# Patient Record
Sex: Female | Born: 1980
Health system: Southern US, Community
[De-identification: ages and names within clinical notes are randomized; demographics above are authoritative.]

## PROBLEM LIST (undated history)

## (undated) DIAGNOSIS — E119 Type 2 diabetes mellitus without complications: Secondary | ICD-10-CM

## (undated) DIAGNOSIS — F419 Anxiety disorder, unspecified: Secondary | ICD-10-CM

## (undated) DIAGNOSIS — J45909 Unspecified asthma, uncomplicated: Secondary | ICD-10-CM

## (undated) DIAGNOSIS — E669 Obesity, unspecified: Secondary | ICD-10-CM

## (undated) HISTORY — PX: DENTAL SURGERY: SHX609

---

## 2003-08-16 ENCOUNTER — Emergency Department (HOSPITAL_COMMUNITY): Admission: EM | Admit: 2003-08-16 | Discharge: 2003-08-16 | Payer: Self-pay | Admitting: Emergency Medicine

## 2003-08-31 ENCOUNTER — Encounter: Payer: Self-pay | Admitting: Emergency Medicine

## 2003-08-31 ENCOUNTER — Emergency Department (HOSPITAL_COMMUNITY): Admission: EM | Admit: 2003-08-31 | Discharge: 2003-08-31 | Payer: Self-pay | Admitting: Emergency Medicine

## 2005-11-17 ENCOUNTER — Inpatient Hospital Stay (HOSPITAL_COMMUNITY): Admission: AD | Admit: 2005-11-17 | Discharge: 2005-11-20 | Payer: Self-pay | Admitting: Family Medicine

## 2005-11-17 ENCOUNTER — Ambulatory Visit: Payer: Self-pay | Admitting: *Deleted

## 2005-12-09 ENCOUNTER — Emergency Department (HOSPITAL_COMMUNITY): Admission: EM | Admit: 2005-12-09 | Discharge: 2005-12-09 | Payer: Self-pay | Admitting: *Deleted

## 2006-07-10 ENCOUNTER — Emergency Department (HOSPITAL_COMMUNITY): Admission: EM | Admit: 2006-07-10 | Discharge: 2006-07-10 | Payer: Self-pay | Admitting: Emergency Medicine

## 2007-03-23 ENCOUNTER — Emergency Department (HOSPITAL_COMMUNITY): Admission: EM | Admit: 2007-03-23 | Discharge: 2007-03-23 | Payer: Self-pay | Admitting: Emergency Medicine

## 2007-04-20 ENCOUNTER — Emergency Department (HOSPITAL_COMMUNITY): Admission: EM | Admit: 2007-04-20 | Discharge: 2007-04-20 | Payer: Self-pay | Admitting: Emergency Medicine

## 2007-05-12 ENCOUNTER — Emergency Department (HOSPITAL_COMMUNITY): Admission: EM | Admit: 2007-05-12 | Discharge: 2007-05-12 | Payer: Self-pay | Admitting: Emergency Medicine

## 2008-02-11 ENCOUNTER — Inpatient Hospital Stay (HOSPITAL_COMMUNITY): Admission: AD | Admit: 2008-02-11 | Discharge: 2008-02-11 | Payer: Self-pay | Admitting: Obstetrics and Gynecology

## 2008-02-17 ENCOUNTER — Emergency Department (HOSPITAL_COMMUNITY): Admission: EM | Admit: 2008-02-17 | Discharge: 2008-02-17 | Payer: Self-pay | Admitting: Emergency Medicine

## 2008-04-16 ENCOUNTER — Emergency Department (HOSPITAL_COMMUNITY): Admission: EM | Admit: 2008-04-16 | Discharge: 2008-04-17 | Payer: Self-pay | Admitting: Emergency Medicine

## 2009-03-04 ENCOUNTER — Emergency Department (HOSPITAL_COMMUNITY): Admission: EM | Admit: 2009-03-04 | Discharge: 2009-03-04 | Payer: Self-pay | Admitting: Emergency Medicine

## 2009-11-10 ENCOUNTER — Inpatient Hospital Stay (HOSPITAL_COMMUNITY): Admission: EM | Admit: 2009-11-10 | Discharge: 2009-11-21 | Payer: Self-pay | Admitting: Psychiatry

## 2009-11-10 ENCOUNTER — Ambulatory Visit: Payer: Self-pay | Admitting: Psychiatry

## 2010-02-25 ENCOUNTER — Emergency Department (HOSPITAL_COMMUNITY): Admission: EM | Admit: 2010-02-25 | Discharge: 2010-02-26 | Payer: Self-pay | Admitting: Emergency Medicine

## 2010-02-26 ENCOUNTER — Inpatient Hospital Stay (HOSPITAL_COMMUNITY): Admission: AD | Admit: 2010-02-26 | Discharge: 2010-03-03 | Payer: Self-pay | Admitting: Psychiatry

## 2010-02-26 ENCOUNTER — Ambulatory Visit: Payer: Self-pay | Admitting: Psychiatry

## 2010-04-05 ENCOUNTER — Ambulatory Visit: Payer: Self-pay | Admitting: Pulmonary Disease

## 2010-04-05 DIAGNOSIS — G4733 Obstructive sleep apnea (adult) (pediatric): Secondary | ICD-10-CM

## 2010-08-12 ENCOUNTER — Emergency Department (HOSPITAL_COMMUNITY): Admission: EM | Admit: 2010-08-12 | Discharge: 2010-08-12 | Payer: Self-pay | Admitting: Emergency Medicine

## 2010-08-16 ENCOUNTER — Emergency Department (HOSPITAL_COMMUNITY): Admission: EM | Admit: 2010-08-16 | Discharge: 2010-08-16 | Payer: Self-pay | Admitting: Family Medicine

## 2010-08-21 ENCOUNTER — Emergency Department (HOSPITAL_COMMUNITY): Admission: EM | Admit: 2010-08-21 | Discharge: 2010-08-21 | Payer: Self-pay | Admitting: Family Medicine

## 2010-08-26 ENCOUNTER — Emergency Department (HOSPITAL_COMMUNITY): Admission: EM | Admit: 2010-08-26 | Discharge: 2010-08-26 | Payer: Self-pay | Admitting: Emergency Medicine

## 2011-01-21 NOTE — Assessment & Plan Note (Signed)
Summary: consult for osa   Copy to:  Dr Phill Mutter Primary Provider/Referring Provider:  Clyda Greener  CC:  Sleep Consult , pt has trouble staying asleep , and takes her 2 hrs to fall asleep averages 4 hrs per night.  History of Present Illness: The pt is a 30y/o female who I have been asked to see for possible osa.  She has been noted to have snoring per observers, as well as an abnormal breathing pattern during sleep.  She denies any choking arousals.  She typically goes to bed around 9pm, and does have difficulty getting to sleep.  It may take her 2 hours to fall asleep.  She arises at 8am to start her day, and does not feel rested.  She also notes frequent awakenings during the night.  She admits to dozing during the day with periods of inactivity, and can fall asleep watching tv in the evening hours.  She does have sleepiness with driving longer distances.  Her weight is up at least 50 pounds over the last 2 years, and her epworth score today is 16.    Past History:  Past Medical History: Asthma allergic disease depression schizophrenia  Past Surgical History: none per pt  Family History: Reviewed history and no changes required. Father Heart Disease Mother asthma and allergy Cousin breast Cancer  Social History: Reviewed history and no changes required. Single with 1 child Homemaker Smokes 3 cigarettes per day  Review of Systems       The patient complains of shortness of breath at rest, weight change, anxiety, and depression.  The patient denies shortness of breath with activity, productive cough, non-productive cough, coughing up blood, chest pain, irregular heartbeats, acid heartburn, indigestion, loss of appetite, abdominal pain, difficulty swallowing, sore throat, tooth/dental problems, headaches, nasal congestion/difficulty breathing through nose, sneezing, itching, ear ache, hand/feet swelling, joint stiffness or pain, rash, change in color of mucus, and fever.     Vital Signs:  Patient profile:   30 year old female Height:      62 inches Weight:      265.2 pounds BMI:     48.68 O2 Sat:      96 % on Room air Temp:     98.3 degrees F oral Pulse rate:   93 / minute  Vitals Entered By: Renold Genta RCP, LPN (April 05, 2010 1:45 PM)  O2 Flow:  Room air CC: Sleep Consult , pt has trouble staying asleep , takes her 2 hrs to fall asleep averages 4 hrs per night Comments Medications reviewed with patient Renold Genta RCP, LPN  April 05, 2010 1:56 PM    Physical Exam  General:  obese female in nad Eyes:  PERRLA and EOMI.   Nose:  mild turbinate hypertrophy Mouth:  mod elongation of soft palate and uvula, with soft tissue redundancy posteriorly Neck:  ?prominent thyroid, no LN, no jvd Lungs:  clear to auscultation Heart:  rrr, no mrg. Abdomen:  soft and nontender, bs+ Extremities:  no edema or cyanosis pulses intact distally Neurologic:  alert and oriented, moves all 4.   Impression & Recommendations:  Problem # 1:  OBSTRUCTIVE SLEEP APNEA (ICD-327.23) The pt's history is very suggestive of osa.  She has gained a large amount of weight, has snoring and pauses noted by observers, and notes nonrestorative sleep with EDS.  I have had a long discussion with the pt about sleep apnea, including its impact on her QOL and CV health.  I think she needs  a sleep study for diagnosis, and will arrange f/u once the results are available.  I have also asked her to try and go to bed later, and this may be responsible for her sleep onset issues.  Medications Added to Medication List This Visit: 1)  Fluoxetine Hcl 10 Mg Caps (Fluoxetine hcl) .... 2 capsules once daily 2)  Levothroid 25 Mcg Tabs (Levothyroxine sodium) .Marland Kitchen.. 1 once daily 3)  Fluphenazine Hcl 10 Mg Tabs (Fluphenazine hcl) .... 1/2 at dinner and 1 at bedtime  Other Orders: Consultation Level IV (16109) Sleep Disorder Referral (Sleep Disorder)  Patient Instructions: 1)  will set up for  sleep study.  I will call you with results when available. 2)  work on weight loss.

## 2011-03-17 LAB — TSH: TSH: 3.704 u[IU]/mL (ref 0.350–4.500)

## 2011-03-17 LAB — URINALYSIS, ROUTINE W REFLEX MICROSCOPIC
Bilirubin Urine: NEGATIVE
Glucose, UA: NEGATIVE mg/dL
Hgb urine dipstick: NEGATIVE
Ketones, ur: NEGATIVE mg/dL
Nitrite: NEGATIVE
Protein, ur: NEGATIVE mg/dL
Specific Gravity, Urine: 1.013 (ref 1.005–1.030)
Urobilinogen, UA: 0.2 mg/dL (ref 0.0–1.0)
pH: 5.5 (ref 5.0–8.0)

## 2011-03-17 LAB — COMPREHENSIVE METABOLIC PANEL
ALT: 9 U/L (ref 0–35)
AST: 16 U/L (ref 0–37)
Albumin: 4.1 g/dL (ref 3.5–5.2)
Alkaline Phosphatase: 57 U/L (ref 39–117)
BUN: 7 mg/dL (ref 6–23)
CO2: 25 mEq/L (ref 19–32)
Calcium: 10.2 mg/dL (ref 8.4–10.5)
Chloride: 104 mEq/L (ref 96–112)
Creatinine, Ser: 0.97 mg/dL (ref 0.4–1.2)
GFR calc Af Amer: >60 mL/min (ref >60)
GFR calc non Af Amer: >60 mL/min (ref >60)
Glucose, Bld: 94 mg/dL (ref 70–99)
Potassium: 3.9 mEq/L (ref 3.5–5.1)
Sodium: 138 mEq/L (ref 135–145)
Total Bilirubin: 0.3 mg/dL (ref 0.3–1.2)
Total Protein: 7.8 g/dL (ref 6.0–8.3)

## 2011-03-17 LAB — RAPID URINE DRUG SCREEN, HOSP PERFORMED
Amphetamines: NOT DETECTED
Barbiturates: NOT DETECTED
Benzodiazepines: NOT DETECTED
Cocaine: NOT DETECTED
Opiates: NOT DETECTED
Tetrahydrocannabinol: NOT DETECTED

## 2011-03-17 LAB — DIFFERENTIAL
Basophils Absolute: 0 10*3/uL (ref 0.0–0.1)
Basophils Relative: 0 % (ref 0–1)
Eosinophils Absolute: 0.2 10*3/uL (ref 0.0–0.7)
Eosinophils Relative: 1 % (ref 0–5)
Lymphocytes Relative: 13 % (ref 12–46)
Lymphs Abs: 1.6 10*3/uL (ref 0.7–4.0)
Monocytes Absolute: 0.5 10*3/uL (ref 0.1–1.0)
Monocytes Relative: 4 % (ref 3–12)
Neutro Abs: 10.3 10*3/uL — ABNORMAL HIGH (ref 1.7–7.7)
Neutrophils Relative %: 82 % — ABNORMAL HIGH (ref 43–77)

## 2011-03-17 LAB — LITHIUM LEVEL: Lithium Lvl: 0.59 mEq/L — ABNORMAL LOW (ref 0.80–1.40)

## 2011-03-17 LAB — CBC
HCT: 41.8 % (ref 36.0–46.0)
Hemoglobin: 13.5 g/dL (ref 12.0–15.0)
MCHC: 32.4 g/dL (ref 30.0–36.0)
MCV: 83.1 fL (ref 78.0–100.0)
Platelets: 417 10*3/uL — ABNORMAL HIGH (ref 150–400)
RBC: 5.02 MIL/uL (ref 3.87–5.11)
RDW: 13 % (ref 11.5–15.5)
WBC: 12.6 10*3/uL — ABNORMAL HIGH (ref 4.0–10.5)

## 2011-03-17 LAB — ETHANOL: Alcohol, Ethyl (B): <5 mg/dL (ref 0–10)

## 2011-03-17 LAB — GLUCOSE, CAPILLARY: Glucose-Capillary: 123 mg/dL — ABNORMAL HIGH (ref 70–99)

## 2011-03-17 LAB — PREGNANCY, URINE: Preg Test, Ur: NEGATIVE

## 2011-03-26 LAB — DRUGS OF ABUSE SCREEN W/O ALC, ROUTINE URINE
Amphetamine Screen, Ur: NEGATIVE
Barbiturate Quant, Ur: NEGATIVE
Benzodiazepines.: NEGATIVE
Cocaine Metabolites: NEGATIVE
Creatinine,U: 76.3 mg/dL
Marijuana Metabolite: NEGATIVE
Methadone: NEGATIVE
Opiate Screen, Urine: NEGATIVE
Phencyclidine (PCP): NEGATIVE
Propoxyphene: NEGATIVE

## 2011-03-26 LAB — URINALYSIS, ROUTINE W REFLEX MICROSCOPIC
Bilirubin Urine: NEGATIVE
Glucose, UA: NEGATIVE mg/dL
Hgb urine dipstick: NEGATIVE
Ketones, ur: NEGATIVE mg/dL
Nitrite: NEGATIVE
Protein, ur: NEGATIVE mg/dL
Specific Gravity, Urine: 1.015 (ref 1.005–1.030)
Urobilinogen, UA: 0.2 mg/dL (ref 0.0–1.0)
pH: 6 (ref 5.0–8.0)

## 2011-03-26 LAB — COMPREHENSIVE METABOLIC PANEL
ALT: 11 U/L (ref 0–35)
AST: 18 U/L (ref 0–37)
Albumin: 3.5 g/dL (ref 3.5–5.2)
Alkaline Phosphatase: 70 U/L (ref 39–117)
BUN: 10 mg/dL (ref 6–23)
CO2: 27 mEq/L (ref 19–32)
Calcium: 9.4 mg/dL (ref 8.4–10.5)
Chloride: 101 mEq/L (ref 96–112)
Creatinine, Ser: 1.09 mg/dL (ref 0.4–1.2)
GFR calc Af Amer: >60 mL/min (ref >60)
GFR calc non Af Amer: 59 mL/min — ABNORMAL LOW (ref >60)
Glucose, Bld: 136 mg/dL — ABNORMAL HIGH (ref 70–99)
Potassium: 3.9 mEq/L (ref 3.5–5.1)
Sodium: 136 mEq/L (ref 135–145)
Total Bilirubin: 0.3 mg/dL (ref 0.3–1.2)
Total Protein: 7.4 g/dL (ref 6.0–8.3)

## 2011-03-26 LAB — PREGNANCY, URINE: Preg Test, Ur: NEGATIVE

## 2011-03-26 LAB — VALPROIC ACID LEVEL
Valproic Acid Lvl: 44 ug/mL — ABNORMAL LOW (ref 50.0–100.0)
Valproic Acid Lvl: 48.1 ug/mL — ABNORMAL LOW (ref 50.0–100.0)

## 2011-03-26 LAB — CBC
HCT: 38.8 % (ref 36.0–46.0)
Hemoglobin: 12.9 g/dL (ref 12.0–15.0)
MCHC: 33.1 g/dL (ref 30.0–36.0)
MCV: 84.3 fL (ref 78.0–100.0)
Platelets: 281 10*3/uL (ref 150–400)
RBC: 4.61 MIL/uL (ref 3.87–5.11)
RDW: 12.9 % (ref 11.5–15.5)
WBC: 8.3 10*3/uL (ref 4.0–10.5)

## 2011-04-03 LAB — POCT I-STAT 4, (NA,K, GLUC, HGB,HCT)
Glucose, Bld: 99 mg/dL (ref 70–99)
HCT: 39 % (ref 36.0–46.0)
Potassium: 3.8 mEq/L (ref 3.5–5.1)

## 2011-05-06 NOTE — Op Note (Signed)
NAMEAUSTRALIA, DROLL               ACCOUNT NO.:  [id number]   MEDICAL RECORD NO.:  [id number]          PATIENT TYPE:  EMS   LOCATION:  MAJO                         FACILITY:  [facility]   PHYSICIAN:  Norma Done, MD  DATE OF BIRTH:  1981/12/10   DATE OF PROCEDURE:  03/04/2009  DATE OF DISCHARGE:                               OPERATIVE REPORT   PREOPERATIVE DIAGNOSIS:  Left forearm laceration with possible nerve  involvement and tendon involvement.   POSTOPERATIVE DIAGNOSIS:  Left forearm laceration with tendon  involvement.   ATTENDING SURGEON:  Norma Covert IV, MD, who was scrubbed and present  for the entire procedure.   ASSISTANT SURGEON:  None.   SURGICAL PROCEDURE:  1. Left forearm ulnar nerve exploration and neurolysis in the level of      the forearm.  2. Left forearm repair of flexor carpi ulnaris muscle.  3. Repair of left forearm laceration, 5.5 cm.   ANESTHESIA:  General via endotracheal tube.   INTRAOPERATIVE FINDINGS:  The patient did have a laceration over the  dorsal ulnar aspect of the forearm which extended all the way down to  the ulna.  The ulnar nerve was in continuity.  The laceration extended  at the musculotendinous junction of the FCU.  The ECU was in continuity.   SURGICAL INDICATIONS:  Norma Rogers is a 30 year old right-hand dominant  female who fell off a ladder and sustained a sharp blow to her left  forearm and sustained a laceration.  The patient presented to the  emergency department with a 5 cm laceration and complaining of numbness  in her hand directly over the ulnar nerve distribution.  The patient was  recommended to undergo the above procedure given the exam findings with  the altered sensation of the ulnar nerve as well as decreased hand  intrinsic strength.  Risks, benefits, and alternatives were discussed in  detail with the patient and signed informed consent was obtained.  Risks  include but not limited to bleeding, infection,  damage to nearby nerves,  arteries or tendons, persistent numbness, loss of motion in the wrist  and digits, and need for further surgical intervention.   DESCRIPTION OF PROCEDURE:  The patient was properly identified in the  preoperative holding area, mark with permanent marker was made on the  left forearm to indicate correct operative site.  The patient was then  brought back to the operating room, placed supine on the anesthesia room  table where general anesthesia was administered.  The patient tolerated  this well.  A well-padded tourniquet was then placed on the left  brachium and sealed with a 1000 drape.  The left upper extremity was  then prepped with Hibiclens and then sterilely draped.  The time-out was  called.  The correct side was identified and the procedure was then  begun.  The left forearm laceration which was approximately 5 cm was  then extended proximally and distally as the limb has been elevated and  the tourniquet insufflated.  Dissection was carried down through the  skin and subcutaneous tissues.  The laceration  extended into the FCU  muscle belly and region at the musculotendinous junction.  This extended  into the ECU region, but not through the ECU tendon.  The ECU tendon was  in continuity.  Following this, further dissection was then carried  volarly, and the ulnar nerve and artery were then both identified.  The  nerve was then identified proximally and distally, and neurolysis was  Rogers surrounding the region of the nerve making sure there was no  identifiable trauma to the nerve.  After neurolysis of the nerve, the  FCU musculotendinous junction was then repaired with a running 2-0  Vicryl suture reinforcing the fascial layer directly over the muscle and  the tendon.  Following this, the wounds were then thoroughly irrigated.  The laceration was then closed in layers.  The subcutaneous tissues  closed with 4-0 Monocryl and the skin closed with horizontal  mattress 4-  0 Prolene sutures.  Adaptic dressing and sterile compressive dressing  were then applied.  The patient tolerated the procedure well, returned  to recovery room in good condition.   POSTOPERATIVE PLAN:  The patient will be discharged to home, she should  keep the splint that was applied clean and dry, as well as I will see  her back in the office in 10 days for wound check and suture removal and  then likely graduate over to short-arm splint for a total of two more  additional Bala, and then begin activity as tolerated.      Norma Done, MD  Electronically Signed     FWO/MEDQ  D:  03/04/2009  T:  03/05/2009  Job:  [id number]

## 2011-05-09 NOTE — Discharge Summary (Signed)
Norma Rogers, Norma Rogers               ACCOUNT NO.:  [id number]   MEDICAL RECORD NO.:  [id number]          PATIENT TYPE:  INP   LOCATION:  9128                          FACILITY:  [facility]   PHYSICIAN:  Barth Kirks, M.D.  DATE OF BIRTH:  1981/05/26   DATE OF ADMISSION:  11/17/2005  DATE OF DISCHARGE:  11/20/2005                                 DISCHARGE SUMMARY   DISCHARGE DIAGNOSES:  1.  Full-term delivery, normal spontaneous vaginal delivery at questionable      35 and 2 Boot.  2.  Delivery of a viable female infant.  3.  Preeclampsia.   DISCHARGE MEDICATIONS:  1.  Ibuprofen 600 mg, one tablet p.o. q.6 h. as needed for pain.  2.  Prenatal vitamins one tablet daily.  3.  Colace 100 mg p.o. b.i.d. as needed as a stool softener.   BRIEF HISTORY OF PRESENT ILLNESS:  The patient is a 30 year old gravida 1,  para 0, who presented at 15 and 2 [place] best estimated gestational age with  uncertain dates of an LMP, dated by an ultrasound performed on November 17, 2005.  The patient was sent from [facility] where her  pregnancy was diagnosed on November 17, 2005.  The patient stated that she  was uncertain and did not know that she was pregnant, thought she had  fibroids, and presented to the [facility] ER complaining of carpal tunnel  like symptoms.  At the time of [facility], they did  a pregnancy test and found out she was pregnant and she was noted to have  contractions appropriately every two to three minutes.  The patient was sent  from [facility] over to the Maternity Admissions Unit, where she was  examined.   BRIEF HOSPITAL COURSE:  The patient was admitted Labor and Delivery on  November 17, 2005.  She was noted to have Trichomonas, as well as in preterm  labor by best estimated gestational age.  There was concern for elevated  blood pressures and preeclampsia.  The patient was started on magnesium, as  well as penicillin during the  delivery, for her unknown GBS status.  The  patient was noted to be contracting and went on to a normal spontaneous  vaginal delivery on November 18, 2005 and delivered a viable female infant  with Apgars of 9 and 9.  The patient was delayed suction due to meconium  stained amnionic fluid.  The patient was under epidural anesthesia.  The  baby was dually suctioned at the perineum.  Spontaneous normal three-vessel  placenta was delivered and cord-blood, and cord-gases were sent.  There was  a secondary laceration repair with 3-0 Vicryl.  The patient and infant were  stable.  The patient was transferred then to the adult ICU and was continued  on her magnesium at that time.  The patient complained of chest pain on her  postpartum day #1, which was ruled out for heart/cardiac origin by negative  cardiac enzymes, as well as a normal EKG.  Chest x-ray revealed bilateral  atelectasis.  The patient  was encouraged to use incentive spirometry.  The  patient continued on and her chest pain resolved on postpartum day #2, as  well as the patient began diuresing well.  Magnesium was discontinued on  November 19, 2005 and the patient remained stable with her blood pressures  on date of discharge in 138/88.  The patient is stable and now ready for  discharge.  Her pain was well controlled.  She would like Depo for  contraception, as well as being rubella equivocal.  She will receive vaccine  prior to discharge.  The patient will follow up in six [place] at [facility].  The patient was noted to be [facility] eligible and will be needing  assistance obtaining financial help for the baby.   LABORATORY ON DATE OF DISCHARGE:  Rubella is equivocal.  RPR was  nonreactive.  Hepatitis B surface antigen was negative.  Her GBS was  positive, and patient received penicillin during the delivery.  Blood type  was 0+.  HIV was nonreactive.      Barth Kirks, M.D.     MB/MEDQ  D:  11/20/2005  T:  11/20/2005  Job:   [id number]

## 2011-09-12 LAB — POCT PREGNANCY, URINE
Operator id: 220991
Operator id: 27065

## 2011-09-12 LAB — URINE MICROSCOPIC-ADD ON

## 2011-09-12 LAB — URINALYSIS, ROUTINE W REFLEX MICROSCOPIC
Ketones, ur: NEGATIVE
Leukocytes, UA: NEGATIVE
Nitrite: NEGATIVE
Protein, ur: NEGATIVE
Protein, ur: NEGATIVE
Urobilinogen, UA: 0.2

## 2011-09-12 LAB — WET PREP, GENITAL: Clue Cells Wet Prep HPF POC: NONE SEEN

## 2011-09-12 LAB — GC/CHLAMYDIA PROBE AMP, GENITAL: GC Probe Amp, Genital: NEGATIVE

## 2011-09-12 LAB — URINE CULTURE

## 2011-12-18 ENCOUNTER — Emergency Department (HOSPITAL_BASED_OUTPATIENT_CLINIC_OR_DEPARTMENT_OTHER)
Admission: EM | Admit: 2011-12-18 | Discharge: 2011-12-18 | Disposition: A | Discharge status: Home / Self Care | Payer: Medicare Other | Attending: Emergency Medicine | Admitting: Emergency Medicine

## 2011-12-18 DIAGNOSIS — Z79899 Other long term (current) drug therapy: Secondary | ICD-10-CM | POA: Insufficient documentation

## 2011-12-18 DIAGNOSIS — F17200 Nicotine dependence, unspecified, uncomplicated: Secondary | ICD-10-CM | POA: Insufficient documentation

## 2011-12-18 DIAGNOSIS — G8918 Other acute postprocedural pain: Secondary | ICD-10-CM | POA: Insufficient documentation

## 2011-12-18 DIAGNOSIS — K089 Disorder of teeth and supporting structures, unspecified: Secondary | ICD-10-CM | POA: Insufficient documentation

## 2011-12-18 MED ORDER — OXYCODONE-ACETAMINOPHEN 5-325 MG PO TABS
1.0000 | ORAL_TABLET | Freq: Once | ORAL | Status: AC
  Administered 2011-12-18: 1 via ORAL
  Filled 2011-12-18: qty 1

## 2011-12-18 MED ORDER — OXYCODONE-ACETAMINOPHEN 5-325 MG PO TABS: 1.0000 | ORAL_TABLET | Freq: Four times a day (QID) | ORAL | Status: AC | PRN

## 2011-12-18 NOTE — ED Notes (Signed)
Pt c/o pain from wisdom teeth extraction on 12/19.   Pt states she received Rx for Ibuprofen but is still in pain.

## 2011-12-18 NOTE — ED Provider Notes (Signed)
History     CSN: [id number]  Arrival date & time 12/18/11  1522   First MD Initiated Contact with Patient 12/18/11 1727      Chief Complaint  Patient presents with  . Dental Pain    (Consider location/radiation/quality/duration/timing/severity/associated sxs/prior treatment) Patient is a 30 y.o. female presenting with tooth pain. The history is provided by the patient.  Dental PainPrimary symptoms do not include sore throat or cough.  Additional symptoms do not include: fatigue.   patient states that she had all 4 wisdom teeth and 2 other teeth removed on December 19. She states she has a high pain threshold was, given ibuprofen. She states that is not working enough, and she's been taking naproxen also. No fevers. No numbness or weakness. No drooling.  History reviewed. No pertinent past medical history.  Past Surgical History  Procedure Date  . Dental surgery     No family history on file.  History  Substance Use Topics  . Smoking status: Current Everyday Smoker  . Smokeless tobacco: Not on file  . Alcohol Use: Yes    OB History    Grav Para Term Preterm Abortions TAB SAB Ect Mult Living                  Review of Systems  Constitutional: Negative for fatigue.  HENT: Positive for dental problem. Negative for sore throat and mouth sores.   Eyes: Negative for pain.  Respiratory: Negative for cough.     Allergies  Review of patient's allergies indicates no known allergies.  Home Medications   Current Outpatient Rx  Name Route Sig Dispense Refill  . AMOXICILLIN 500 MG PO CAPS Oral Take 500 mg by mouth 3 (three) times daily.      . IBUPROFEN 800 MG PO TABS Oral Take 800 mg by mouth every 6 (six) hours as needed. For pain     . MEDROXYPROGESTERONE ACETATE 150 MG/ML IM SUSP Intramuscular Inject 150 mg into the muscle every 3 (three) months.      Marland Kitchen NAPROXEN SODIUM 220 MG PO TABS Oral Take 440 mg by mouth 2 (two) times daily as needed. For pain     .  OXYCODONE-ACETAMINOPHEN 5-325 MG PO TABS Oral Take 1-2 tablets by mouth every 6 (six) hours as needed for pain. 20 tablet 0    BP 138/86  Pulse 84  Temp(Src) 98.3 F (36.8 C) (Oral)  Resp 16  Ht 5\' 3"  (1.6 m)  Wt 225 lb (102.059 kg)  BMI 39.86 kg/m2  SpO2 100%  LMP 11/30/2011  Physical Exam  Constitutional: She appears well-developed.  HENT:  Head: Normocephalic and atraumatic.       All 4 wisdom teeth post extraction. Also post extraction left upper tooth anterior to wisdom tooth. Also left lower left of midline tooth. Extraction sites appear well.    ED Course  Procedures (including critical care time)  Labs Reviewed - No data to display No results found.   1. Post-operative pain       MDM  Post dental extraction. Has pain. No signs of infection appears to be healing well. We'll give him pain medicines and will follow with her dentist.        Juliet Rude. Rubin Payor, MD 12/18/11 1737

## 2013-03-07 ENCOUNTER — Emergency Department (HOSPITAL_BASED_OUTPATIENT_CLINIC_OR_DEPARTMENT_OTHER): Payer: Medicare Other

## 2013-03-07 ENCOUNTER — Encounter (HOSPITAL_BASED_OUTPATIENT_CLINIC_OR_DEPARTMENT_OTHER): Payer: Self-pay | Admitting: *Deleted

## 2013-03-07 ENCOUNTER — Emergency Department (HOSPITAL_BASED_OUTPATIENT_CLINIC_OR_DEPARTMENT_OTHER)
Admission: EM | Admit: 2013-03-07 | Discharge: 2013-03-07 | Disposition: A | Discharge status: Home / Self Care | Payer: Medicare Other | Attending: Emergency Medicine | Admitting: Emergency Medicine

## 2013-03-07 DIAGNOSIS — F17200 Nicotine dependence, unspecified, uncomplicated: Secondary | ICD-10-CM | POA: Insufficient documentation

## 2013-03-07 DIAGNOSIS — M549 Dorsalgia, unspecified: Secondary | ICD-10-CM | POA: Insufficient documentation

## 2013-03-07 DIAGNOSIS — R42 Dizziness and giddiness: Secondary | ICD-10-CM | POA: Insufficient documentation

## 2013-03-07 DIAGNOSIS — IMO0001 Reserved for inherently not codable concepts without codable children: Secondary | ICD-10-CM | POA: Insufficient documentation

## 2013-03-07 DIAGNOSIS — F0781 Postconcussional syndrome: Secondary | ICD-10-CM

## 2013-03-07 DIAGNOSIS — Z79899 Other long term (current) drug therapy: Secondary | ICD-10-CM | POA: Insufficient documentation

## 2013-03-07 MED ORDER — TRAMADOL HCL 50 MG PO TABS: 50.0000 mg | ORAL_TABLET | Freq: Four times a day (QID) | ORAL | Status: DC | PRN

## 2013-03-07 MED ORDER — HYDROCODONE-ACETAMINOPHEN 5-325 MG PO TABS
2.0000 | ORAL_TABLET | Freq: Once | ORAL | Status: AC
  Administered 2013-03-07: 2 via ORAL
  Filled 2013-03-07: qty 2
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
## 2013-03-07 NOTE — ED Provider Notes (Signed)
Medical screening examination/treatment/procedure(s) were performed by non-physician practitioner and as supervising physician I was immediately available for consultation/collaboration.   Gwyneth Sprout, MD 03/07/13 [phone]

## 2014-04-24 ENCOUNTER — Ambulatory Visit (HOSPITAL_BASED_OUTPATIENT_CLINIC_OR_DEPARTMENT_OTHER): Payer: Medicare Other | Attending: Medical

## 2015-10-17 ENCOUNTER — Encounter (HOSPITAL_COMMUNITY): Payer: Self-pay | Admitting: *Deleted

## 2015-10-17 ENCOUNTER — Emergency Department (HOSPITAL_COMMUNITY): Payer: Medicare Other

## 2015-10-17 ENCOUNTER — Emergency Department (HOSPITAL_COMMUNITY)
Admission: EM | Admit: 2015-10-17 | Discharge: 2015-10-17 | Disposition: A | Discharge status: Home / Self Care | Payer: Medicare Other | Attending: Emergency Medicine | Admitting: Emergency Medicine

## 2015-10-17 DIAGNOSIS — E1165 Type 2 diabetes mellitus with hyperglycemia: Secondary | ICD-10-CM | POA: Diagnosis present

## 2015-10-17 DIAGNOSIS — R062 Wheezing: Secondary | ICD-10-CM

## 2015-10-17 DIAGNOSIS — Z720 Tobacco use: Secondary | ICD-10-CM | POA: Insufficient documentation

## 2015-10-17 DIAGNOSIS — E669 Obesity, unspecified: Secondary | ICD-10-CM | POA: Diagnosis not present

## 2015-10-17 DIAGNOSIS — Z792 Long term (current) use of antibiotics: Secondary | ICD-10-CM | POA: Insufficient documentation

## 2015-10-17 DIAGNOSIS — F419 Anxiety disorder, unspecified: Secondary | ICD-10-CM | POA: Diagnosis not present

## 2015-10-17 DIAGNOSIS — J45901 Unspecified asthma with (acute) exacerbation: Secondary | ICD-10-CM | POA: Diagnosis not present

## 2015-10-17 DIAGNOSIS — R739 Hyperglycemia, unspecified: Secondary | ICD-10-CM

## 2015-10-17 HISTORY — DX: Type 2 diabetes mellitus without complications: E11.9

## 2015-10-17 HISTORY — DX: Unspecified asthma, uncomplicated: J45.909

## 2015-10-17 HISTORY — DX: Obesity, unspecified: E66.9

## 2015-10-17 HISTORY — DX: Anxiety disorder, unspecified: F41.9

## 2015-10-17 LAB — URINALYSIS, ROUTINE W REFLEX MICROSCOPIC
Bilirubin Urine: NEGATIVE
GLUCOSE, UA: NEGATIVE mg/dL
HGB URINE DIPSTICK: NEGATIVE
KETONES UR: NEGATIVE mg/dL
Leukocytes, UA: NEGATIVE
Nitrite: NEGATIVE
PH: 5.5 (ref 5.0–8.0)
PROTEIN: NEGATIVE mg/dL
Specific Gravity, Urine: 1.014 (ref 1.005–1.030)
Urobilinogen, UA: 0.2 mg/dL (ref 0.0–1.0)

## 2015-10-17 LAB — CBC
HCT: 30.6 % — ABNORMAL LOW (ref 36.0–46.0)
Hemoglobin: 10.3 g/dL — ABNORMAL LOW (ref 12.0–15.0)
MCH: 25.8 pg — AB (ref 26.0–34.0)
MCHC: 33.7 g/dL (ref 30.0–36.0)
MCV: 76.7 fL — ABNORMAL LOW (ref 78.0–100.0)
PLATELETS: 344 10*3/uL (ref 150–400)
RBC: 3.99 MIL/uL (ref 3.87–5.11)
RDW: 13.7 % (ref 11.5–15.5)
WBC: 12 10*3/uL — ABNORMAL HIGH (ref 4.0–10.5)

## 2015-10-17 LAB — BASIC METABOLIC PANEL
Anion gap: 12 (ref 5–15)
BUN: 7 mg/dL (ref 6–20)
CO2: 20 mmol/L — AB (ref 22–32)
CREATININE: 0.82 mg/dL (ref 0.44–1.00)
Calcium: 8.7 mg/dL — ABNORMAL LOW (ref 8.9–10.3)
Chloride: 103 mmol/L (ref 101–111)
GFR calc Af Amer: >60 mL/min (ref >60)
GFR calc non Af Amer: >60 mL/min (ref >60)
GLUCOSE: 208 mg/dL — AB (ref 65–99)
Potassium: 3.9 mmol/L (ref 3.5–5.1)
Sodium: 135 mmol/L (ref 135–145)

## 2015-10-17 LAB — CBG MONITORING, ED: Glucose-Capillary: 251 mg/dL — ABNORMAL HIGH (ref 65–99)

## 2015-10-17 MED ORDER — ALBUTEROL SULFATE (2.5 MG/3ML) 0.083% IN NEBU
5.0000 mg | INHALATION_SOLUTION | Freq: Once | RESPIRATORY_TRACT | Status: AC
  Administered 2015-10-17: 5 mg via RESPIRATORY_TRACT

## 2015-10-17 MED ORDER — ALBUTEROL SULFATE HFA 108 (90 BASE) MCG/ACT IN AERS: 2.0000 | INHALATION_SPRAY | RESPIRATORY_TRACT | Status: DC | PRN

## 2015-10-17 MED ORDER — IPRATROPIUM BROMIDE 0.02 % IN SOLN
0.5000 mg | Freq: Once | RESPIRATORY_TRACT | Status: AC
  Administered 2015-10-17: 0.5 mg via RESPIRATORY_TRACT
  Filled 2015-10-17: qty 2.5

## 2015-10-17 MED ORDER — PREDNISONE 20 MG PO TABS: 60.0000 mg | ORAL_TABLET | Freq: Every day | ORAL | Status: DC

## 2015-10-17 MED ORDER — METFORMIN HCL 500 MG PO TABS: 1000.0000 mg | ORAL_TABLET | Freq: Two times a day (BID) | ORAL | Status: AC

## 2015-10-17 MED ORDER — SODIUM CHLORIDE 0.9 % IV BOLUS (SEPSIS)
500.0000 mL | Freq: Once | INTRAVENOUS | Status: AC
  Administered 2015-10-17: 500 mL via INTRAVENOUS

## 2015-10-17 MED ORDER — ALBUTEROL SULFATE (2.5 MG/3ML) 0.083% IN NEBU
5.0000 mg | INHALATION_SOLUTION | Freq: Once | RESPIRATORY_TRACT | Status: AC
  Administered 2015-10-17: 5 mg via RESPIRATORY_TRACT
  Filled 2015-10-17: qty 6

## 2015-10-17 MED ORDER — METHYLPREDNISOLONE SODIUM SUCC 125 MG IJ SOLR
125.0000 mg | Freq: Once | INTRAMUSCULAR | Status: AC
  Administered 2015-10-17: 125 mg via INTRAVENOUS
  Filled 2015-10-17: qty 2

## 2015-10-17 MED ORDER — ALBUTEROL SULFATE (2.5 MG/3ML) 0.083% IN NEBU
INHALATION_SOLUTION | RESPIRATORY_TRACT | Status: AC
  Filled 2015-10-17: qty 6

## 2015-10-17 NOTE — ED Provider Notes (Signed)
CSN: [id number]     Arrival date & time 10/17/15  1808 History   First MD Initiated Contact with Patient 10/17/15 1837     Chief Complaint  Patient presents with  . Hyperglycemia  . Wheezing     (Consider location/radiation/quality/duration/timing/severity/associated sxs/prior Treatment) Patient is a 34 y.o. female presenting with hyperglycemia and wheezing. The history is provided by the patient.  Hyperglycemia Associated symptoms: polyuria and shortness of breath   Associated symptoms: no abdominal pain, no chest pain, no confusion, no dysuria, no fever and no vomiting   Wheezing Associated symptoms: cough and shortness of breath   Associated symptoms: no chest pain, no fever, no headaches, no rash and no sore throat   Patient notes hx niddm, on metformin, and that blood sugar was 500 when checked at home.  +polyuria. +drinking a lot of water. Denies dysuria. No fever or chills. Also states hx asthma, and out of inhaler. Pt notes increased wheezing the past week. +increased coughing, occasionally with yellow phlegm.  States stopped smoking 1 month ago. Denies sore throat or runny nose. No known ill contacts. No nvd.  No recent wt change.  Was on depo, last had 2 yrs ago.        Past Medical History  Diagnosis Date  . Obesity   . Diabetes mellitus without complication (HCC)   . Anxiety   . Asthma    Past Surgical History  Procedure Laterality Date  . Dental surgery     History reviewed. No pertinent family history. Social History  Substance Use Topics  . Smoking status: Current Every Day Smoker  . Smokeless tobacco: None  . Alcohol Use: Yes   OB History    No data available     Review of Systems  Constitutional: Negative for fever and chills.  HENT: Negative for sore throat.   Eyes: Negative for redness.  Respiratory: Positive for cough, shortness of breath and wheezing.   Cardiovascular: Negative for chest pain.  Gastrointestinal: Negative for vomiting,  abdominal pain and diarrhea.  Endocrine: Positive for polyuria.  Genitourinary: Negative for dysuria, flank pain, vaginal bleeding and vaginal discharge.  Musculoskeletal: Negative for back pain and neck pain.  Skin: Negative for rash.  Neurological: Negative for headaches.  Hematological: Does not bruise/bleed easily.  Psychiatric/Behavioral: Negative for confusion.      Allergies  Lamictal  Home Medications   Prior to Admission medications   Medication Sig Start Date End Date Taking? Authorizing Provider  amoxicillin (AMOXIL) 500 MG capsule Take 500 mg by mouth 3 (three) times daily.      Historical Provider, MD  ibuprofen (ADVIL,MOTRIN) 800 MG tablet Take 800 mg by mouth every 6 (six) hours as needed. For pain     Historical Provider, MD  medroxyPROGESTERone (DEPO-PROVERA) 150 MG/ML injection Inject 150 mg into the muscle every 3 (three) months.      Historical Provider, MD  naproxen sodium (ANAPROX) 220 MG tablet Take 440 mg by mouth 2 (two) times daily as needed. For pain     Historical Provider, MD  Ondansetron HCl (ZOFRAN PO) Take by mouth.    Historical Provider, MD  traMADol (ULTRAM) 50 MG tablet Take 1 tablet (50 mg total) by mouth every 6 (six) hours as needed for pain. 03/07/13   Kaitlyn Szekalski, PA-C   BP 123/80 mmHg  Pulse 120  Temp(Src) 98.7 F (37.1 C) (Oral)  Resp 26  Ht  (1.575 m)  Wt 332 lb 3.2 oz (150.685 kg)  BMI  60.74 kg/m2  SpO2 97%  LMP 09/11/2015 Physical Exam  Constitutional: She appears well-developed and well-nourished. No distress.  HENT:  Mouth/Throat: Oropharynx is clear and moist.  Eyes: Conjunctivae are normal. No scleral icterus.  Neck: Neck supple. No tracheal deviation present.  Cardiovascular: Regular rhythm, normal heart sounds and intact distal pulses.  Exam reveals no gallop and no friction rub.   No murmur heard. Pulmonary/Chest: Effort normal. No respiratory distress. She has wheezes.  Abdominal: Soft. Normal appearance  and bowel sounds are normal. She exhibits no distension. There is no tenderness.  obese  Genitourinary:  No cva tenderness  Musculoskeletal:  Bilateral ankle/foot edema  Neurological: She is alert.  Skin: Skin is warm and dry. No rash noted.  Psychiatric: She has a normal mood and affect.  Nursing note and vitals reviewed.   ED Course  Procedures (including critical care time) Labs Review   Results for orders placed or performed during the hospital encounter of 10/17/15  Basic metabolic panel  Result Value Ref Range   Sodium 135 135 - 145 mmol/L   Potassium 3.9 3.5 - 5.1 mmol/L   Chloride 103 101 - 111 mmol/L   CO2 20 (L) 22 - 32 mmol/L   Glucose, Bld 208 (H) 65 - 99 mg/dL   BUN 7 6 - 20 mg/dL   Creatinine, Ser 1.610.82 0.44 - 1.00 mg/dL   Calcium 8.7 (L) 8.9 - 10.3 mg/dL   GFR calc non Af Amer >60 >60 mL/min   GFR calc Af Amer >60 >60 mL/min   Anion gap 12 5 - 15  CBC  Result Value Ref Range   WBC 12.0 (H) 4.0 - 10.5 K/uL   RBC 3.99 3.87 - 5.11 MIL/uL   Hemoglobin 10.3 (L) 12.0 - 15.0 g/dL   HCT 09.630.6 (L) 04.536.0 - 40.946.0 %   MCV 76.7 (L) 78.0 - 100.0 fL   MCH 25.8 (L) 26.0 - 34.0 pg   MCHC 33.7 30.0 - 36.0 g/dL   RDW 81.113.7 91.411.5 - 78.215.5 %   Platelets 344 150 - 400 K/uL  CBG monitoring, ED  Result Value Ref Range   Glucose-Capillary 251 (H) 65 - 99 mg/dL   Dg Chest 2 View  95/62/130810/26/2016  CLINICAL DATA:  Shortness of breath with exertion.  Dizziness. EXAM: CHEST  2 VIEW COMPARISON:  December 25, 2014 FINDINGS: Lungs are clear. Heart size and pulmonary vascularity are normal. No adenopathy. No bone lesions. IMPRESSION: No edema or consolidation. Electronically Signed   By: Bretta BangWilliam  Woodruff III M.D.   On: 10/17/2015 18:59      I have personally reviewed and evaluated these images and lab results as part of my medical decision-making.   EKG Interpretation   Date/Time:  Wednesday October 17 2015 18:26:41 EDT Ventricular Rate:  118 PR Interval:  140 QRS Duration: 76 QT  Interval:  354 QTC Calculation: 496 R Axis:   53 Text Interpretation:  Sinus tachycardia Otherwise normal ECG Baseline  wander Confirmed by Denton LankSTEINL  MD, Caryn BeeKEVIN ([id number]) on 10/17/2015 6:57:17 PM      MDM   Iv ns bolus. Labs.  Reviewed nursing notes and prior charts for additional history.   Albuterol and atrovent neb.  Solumedrol iv.  Persistent wheeze albeit improved.  Alb neb.  Recheck ambulatory to bathroom. No increased wob. Wheezing improved/resolved, good air exchange.  Pulse ox 100%.  bs in 200s, improved from pt report earlier.   No nv. Tolerating po.   Will give alb mdi for home.  rec close pcp f/u. Will refer to [facility].  Will give rx pred and alb mdi for home.  Pt indicates also on metformin 500 mg bid, will give rx.        Cathren Laine, MD 10/17/15 2053

## 2015-10-17 NOTE — ED Notes (Signed)
Pt reports not feeling well, having fatigue and dizziness so she checked cbg and it was >400. Also has swelling to right foot, wheezing and sob. HR 120's at triage.

## 2015-10-17 NOTE — Discharge Instructions (Signed)
It was our pleasure to provide your ER care today - we hope that you feel better.  Continue metformin, drink adequate water, and follow diabetic diet.  Monitor blood sugars 4x/day and record values.  Use albuterol inhaler as need. Take prednisone as prescribed.  Follow up with primary care doctor in the next couple days - call in AM tomorrow to arrange appointment.   Return to ER right away if worse, fevers, trouble breathing, persistent vomiting, increased swelling, other concern.       Hyperglycemia Hyperglycemia occurs when the glucose (sugar) in your blood is too high. Hyperglycemia can happen for many reasons, but it most often happens to people who do not know they have diabetes or are not managing their diabetes properly.  CAUSES  Whether you have diabetes or not, there are other causes of hyperglycemia. Hyperglycemia can occur when you have diabetes, but it can also occur in other situations that you might not be as aware of, such as: Diabetes  If you have diabetes and are having problems controlling your blood glucose, hyperglycemia could occur because of some of the following reasons:  Not following your meal plan.  Not taking your diabetes medications or not taking it properly.  Exercising less or doing less activity than you normally do.  Being sick. Pre-diabetes  This cannot be ignored. Before people develop Type 2 diabetes, they almost always have "pre-diabetes." This is when your blood glucose levels are higher than normal, but not yet high enough to be diagnosed as diabetes. Research has shown that some long-term damage to the body, especially the heart and circulatory system, may already be occurring during pre-diabetes. If you take action to manage your blood glucose when you have pre-diabetes, you may delay or prevent Type 2 diabetes from developing. Stress  If you have diabetes, you may be "diet" controlled or on oral medications or insulin to control your  diabetes. However, you may find that your blood glucose is higher than usual in the hospital whether you have diabetes or not. This is often referred to as "stress hyperglycemia." Stress can elevate your blood glucose. This happens because of hormones put out by the body during times of stress. If stress has been the cause of your high blood glucose, it can be followed regularly by your caregiver. That way he/she can make sure your hyperglycemia does not continue to get worse or progress to diabetes. Steroids  Steroids are medications that act on the infection fighting system (immune system) to block inflammation or infection. One side effect can be a rise in blood glucose. Most people can produce enough extra insulin to allow for this rise, but for those who cannot, steroids make blood glucose levels go even higher. It is not unusual for steroid treatments to "uncover" diabetes that is developing. It is not always possible to determine if the hyperglycemia will go away after the steroids are stopped. A special blood test called an A1c is sometimes done to determine if your blood glucose was elevated before the steroids were started. SYMPTOMS  Thirsty.  Frequent urination.  Dry mouth.  Blurred vision.  Tired or fatigue.  Weakness.  Sleepy.  Tingling in feet or leg. DIAGNOSIS  Diagnosis is made by monitoring blood glucose in one or all of the following ways:  A1c test. This is a chemical found in your blood.  Fingerstick blood glucose monitoring.  Laboratory results. TREATMENT  First, knowing the cause of the hyperglycemia is important before the hyperglycemia can  be treated. Treatment may include, but is not be limited to:  Education.  Change or adjustment in medications.  Change or adjustment in meal plan.  Treatment for an illness, infection, etc.  More frequent blood glucose monitoring.  Change in exercise plan.  Decreasing or stopping steroids.  Lifestyle  changes. HOME CARE INSTRUCTIONS   Test your blood glucose as directed.  Exercise regularly. Your caregiver will give you instructions about exercise. Pre-diabetes or diabetes which comes on with stress is helped by exercising.  Eat wholesome, balanced meals. Eat often and at regular, fixed times. Your caregiver or nutritionist will give you a meal plan to guide your sugar intake.  Being at an ideal weight is important. If needed, losing as little as 10 to 15 pounds may help improve blood glucose levels. SEEK MEDICAL CARE IF:   You have questions about medicine, activity, or diet.  You continue to have symptoms (problems such as increased thirst, urination, or weight gain). SEEK IMMEDIATE MEDICAL CARE IF:   You are vomiting or have diarrhea.  Your breath smells fruity.  You are breathing faster or slower.  You are very sleepy or incoherent.  You have numbness, tingling, or pain in your feet or hands.  You have chest pain.  Your symptoms get worse even though you have been following your caregiver's orders.  If you have any other questions or concerns.   This information is not intended to replace advice given to you by your health care provider. Make sure you discuss any questions you have with your health care provider.   Document Released: 06/03/2001 Document Revised: 03/01/2012 Document Reviewed: 08/14/2015 Elsevier Interactive Patient Education 2016 [employer]   Type 2 Diabetes Mellitus, Adult Type 2 diabetes mellitus, often simply referred to as type 2 diabetes, is a long-lasting (chronic) disease. In type 2 diabetes, the pancreas does not make enough insulin (a hormone), the cells are less responsive to the insulin that is made (insulin resistance), or both. Normally, insulin moves sugars from food into the tissue cells. The tissue cells use the sugars for energy. The lack of insulin or the lack of normal response to insulin causes excess sugars to build up in the  blood instead of going into the tissue cells. As a result, high blood sugar (hyperglycemia) develops. The effect of high sugar (glucose) levels can cause many complications. Type 2 diabetes was also previously called adult-onset diabetes, but it can occur at any age.  RISK FACTORS  A person is predisposed to developing type 2 diabetes if someone in the family has the disease and also has one or more of the following primary risk factors:  Weight gain, or being overweight or obese.  An inactive lifestyle.  A history of consistently eating high-calorie foods. Maintaining a normal weight and regular physical activity can reduce the chance of developing type 2 diabetes. SYMPTOMS  A person with type 2 diabetes may not show symptoms initially. The symptoms of type 2 diabetes appear slowly. The symptoms include:  Increased thirst (polydipsia).  Increased urination (polyuria).  Increased urination during the night (nocturia).  Sudden or unexplained weight changes.  Frequent, recurring infections.  Tiredness (fatigue).  Weakness.  Vision changes, such as blurred vision.  Fruity smell to your breath.  Abdominal pain.  Nausea or vomiting.  Cuts or bruises which are slow to heal.  Tingling or numbness in the hands or feet.  An open skin wound (ulcer). DIAGNOSIS Type 2 diabetes is frequently not diagnosed until complications  of diabetes are present. Type 2 diabetes is diagnosed when symptoms or complications are present and when blood glucose levels are increased. Your blood glucose level may be checked by one or more of the following blood tests:  A fasting blood glucose test. You will not be allowed to eat for at least 8 hours before a blood sample is taken.  A random blood glucose test. Your blood glucose is checked at any time of the day regardless of when you ate.  A hemoglobin A1c blood glucose test. A hemoglobin A1c test provides information about blood glucose control  over the previous 3 months.  An oral glucose tolerance test (OGTT). Your blood glucose is measured after you have not eaten (fasted) for 2 hours and then after you drink a glucose-containing beverage. TREATMENT   You may need to take insulin or diabetes medicine daily to keep blood glucose levels in the desired range.  If you use insulin, you may need to adjust the dosage depending on the carbohydrates that you eat with each meal or snack.  Lifestyle changes are recommended as part of your treatment. These may include:  Following an individualized diet plan developed by a nutritionist or dietitian.  Exercising daily. Your health care providers will set individualized treatment goals for you based on your age, your medicines, how long you have had diabetes, and any other medical conditions you have. Generally, the goal of treatment is to maintain the following blood glucose levels:  Before meals (preprandial): 80-130 mg/dL.  After meals (postprandial): below 180 mg/dL.  A1c: less than 6.5-7%. HOME CARE INSTRUCTIONS   Have your hemoglobin A1c level checked twice a year.  Perform daily blood glucose monitoring as directed by your health care provider.  Monitor urine ketones when you are ill and as directed by your health care provider.  Take your diabetes medicine or insulin as directed by your health care provider to maintain your blood glucose levels in the desired range.  Never run out of diabetes medicine or insulin. It is needed every day.  If you are using insulin, you may need to adjust the amount of insulin given based on your intake of carbohydrates. Carbohydrates can raise blood glucose levels but need to be included in your diet. Carbohydrates provide vitamins, minerals, and fiber which are an essential part of a healthy diet. Carbohydrates are found in fruits, vegetables, whole grains, dairy products, legumes, and foods containing added sugars.  Eat healthy foods. You  should make an appointment to see a registered dietitian to help you create an eating plan that is right for you.  Lose weight if you are overweight.  Carry a medical alert card or wear your medical alert jewelry.  Carry a 15-gram carbohydrate snack with you at all times to treat low blood glucose (hypoglycemia). Some examples of 15-gram carbohydrate snacks include:  Glucose tablets, 3 or 4.  Glucose gel, 15-gram tube.  Raisins, 2 tablespoons (24 grams).  Jelly beans, 6.  Animal crackers, 8.  Regular pop, 4 ounces (120 mL).  Gummy treats, 9.  Recognize hypoglycemia. Hypoglycemia occurs with blood glucose levels of 70 mg/dL and below. The risk for hypoglycemia increases when fasting or skipping meals, during or after intense exercise, and during sleep. Hypoglycemia symptoms can include:  Tremors or shakes.  Decreased ability to concentrate.  Sweating.  Increased heart rate.  Headache.  Dry mouth.  Hunger.  Irritability.  Anxiety.  Restless sleep.  Altered speech or coordination.  Confusion.  Treat hypoglycemia  promptly. If you are alert and able to safely swallow, follow the 15:15 rule:  Take 15-20 grams of rapid-acting glucose or carbohydrate. Rapid-acting options include glucose gel, glucose tablets, or 4 ounces (120 mL) of fruit juice, regular soda, or low-fat milk.  Check your blood glucose level 15 minutes after taking the glucose.  Take 15-20 grams more of glucose if the repeat blood glucose level is still 70 mg/dL or below.  Eat a meal or snack within 1 hour once blood glucose levels return to normal.  Be alert to feeling very thirsty and urinating more frequently than usual, which are early signs of hyperglycemia. An early awareness of hyperglycemia allows for prompt treatment. Treat hyperglycemia as directed by your health care provider.  Engage in at least 150 minutes of moderate-intensity physical activity a week, spread over at least 3 days of  the week or as directed by your health care provider. In addition, you should engage in resistance exercise at least 2 times a week or as directed by your health care provider. Try to spend no more than 90 minutes at one time inactive.  Adjust your medicine and food intake as needed if you start a new exercise or sport.  Follow your sick-day plan anytime you are unable to eat or drink as usual.  Do not use any tobacco products including cigarettes, chewing tobacco, or electronic cigarettes. If you need help quitting, ask your health care provider.  Limit alcohol intake to no more than 1 drink per day for nonpregnant women and 2 drinks per day for men. You should drink alcohol only when you are also eating food. Talk with your health care provider whether alcohol is safe for you. Tell your health care provider if you drink alcohol several times a week.  Keep all follow-up visits as directed by your health care provider. This is important.  Schedule an eye exam soon after the diagnosis of type 2 diabetes and then annually.  Perform daily skin and foot care. Examine your skin and feet daily for cuts, bruises, redness, nail problems, bleeding, blisters, or sores. A foot exam by a health care provider should be done annually.  Brush your teeth and gums at least twice a day and floss at least once a day. Follow up with your dentist regularly.  Share your diabetes management plan with your workplace or school.  Keep your immunizations up to date. It is recommended that you receive a flu (influenza) vaccine every year. It is also recommended that you receive a pneumonia (pneumococcal) vaccine. If you are 20 years of age or older and have never received a pneumonia vaccine, this vaccine may be given as a series of two separate shots. Ask your health care provider which additional vaccines may be recommended.  Learn to manage stress.  Obtain ongoing diabetes education and support as  needed.  Participate in or seek rehabilitation as needed to maintain or improve independence and quality of life. Request a physical or occupational therapy referral if you are having foot or hand numbness, or difficulties with grooming, dressing, eating, or physical activity. SEEK MEDICAL CARE IF:   You are unable to eat food or drink fluids for more than 6 hours.  You have nausea and vomiting for more than 6 hours.  Your blood glucose level is over 240 mg/dL.  There is a change in mental status.  You develop an additional serious illness.  You have diarrhea for more than 6 hours.  You have been  sick or have had a fever for a couple of days and are not getting better.  You have pain during any physical activity.  SEEK IMMEDIATE MEDICAL CARE IF:  You have difficulty breathing.  You have moderate to large ketone levels.   This information is not intended to replace advice given to you by your health care provider. Make sure you discuss any questions you have with your health care provider.   Document Released: 12/08/2005 Document Revised: 08/29/2015 Document Reviewed: 07/06/2012 Elsevier Interactive Patient Education 2016 [employer].    Diabetes Mellitus and Food It is important for you to manage your blood sugar (glucose) level. Your blood glucose level can be greatly affected by what you eat. Eating healthier foods in the appropriate amounts throughout the day at about the same time each day will help you control your blood glucose level. It can also help slow or prevent worsening of your diabetes mellitus. Healthy eating may even help you improve the level of your blood pressure and reach or maintain a healthy weight.  General recommendations for healthful eating and cooking habits include:  Eating meals and snacks regularly. Avoid going long periods of time without eating to lose weight.  Eating a diet that consists mainly of plant-based foods, such as fruits,  vegetables, nuts, legumes, and whole grains.  Using low-heat cooking methods, such as baking, instead of high-heat cooking methods, such as deep frying. Work with your dietitian to make sure you understand how to use the Nutrition Facts information on food labels. HOW CAN FOOD AFFECT ME? Carbohydrates Carbohydrates affect your blood glucose level more than any other type of food. Your dietitian will help you determine how many carbohydrates to eat at each meal and teach you how to count carbohydrates. Counting carbohydrates is important to keep your blood glucose at a healthy level, especially if you are using insulin or taking certain medicines for diabetes mellitus. Alcohol Alcohol can cause sudden decreases in blood glucose (hypoglycemia), especially if you use insulin or take certain medicines for diabetes mellitus. Hypoglycemia can be a life-threatening condition. Symptoms of hypoglycemia (sleepiness, dizziness, and disorientation) are similar to symptoms of having too much alcohol.  If your health care provider has given you approval to drink alcohol, do so in moderation and use the following guidelines:  Women should not have more than one drink per day, and men should not have more than two drinks per day. One drink is equal to:  12 oz of beer.  5 oz of wine.  1 oz of hard liquor.  Do not drink on an empty stomach.  Keep yourself hydrated. Have water, diet soda, or unsweetened iced tea.  Regular soda, juice, and other mixers might contain a lot of carbohydrates and should be counted. WHAT FOODS ARE NOT RECOMMENDED? As you make food choices, it is important to remember that all foods are not the same. Some foods have fewer nutrients per serving than other foods, even though they might have the same number of calories or carbohydrates. It is difficult to get your body what it needs when you eat foods with fewer nutrients. Examples of foods that you should avoid that are high in  calories and carbohydrates but low in nutrients include:  Trans fats (most processed foods list trans fats on the Nutrition Facts label).  Regular soda.  Juice.  Candy.  Sweets, such as cake, pie, doughnuts, and cookies.  Fried foods. WHAT FOODS CAN I EAT? Eat nutrient-rich foods, which will nourish  your body and keep you healthy. The food you should eat also will depend on several factors, including:  The calories you need.  The medicines you take.  Your weight.  Your blood glucose level.  Your blood pressure level.  Your cholesterol level. You should eat a variety of foods, including:  Protein.  Lean cuts of meat.  Proteins low in saturated fats, such as fish, egg whites, and beans. Avoid processed meats.  Fruits and vegetables.  Fruits and vegetables that may help control blood glucose levels, such as apples, mangoes, and yams.  Dairy products.  Choose fat-free or low-fat dairy products, such as milk, yogurt, and cheese.  Grains, bread, pasta, and rice.  Choose whole grain products, such as multigrain bread, whole oats, and brown rice. These foods may help control blood pressure.  Fats.  Foods containing healthful fats, such as nuts, avocado, olive oil, canola oil, and fish. DOES EVERYONE WITH DIABETES MELLITUS HAVE THE SAME MEAL PLAN? Because every person with diabetes mellitus is different, there is not one meal plan that works for everyone. It is very important that you meet with a dietitian who will help you create a meal plan that is just right for you.   This information is not intended to replace advice given to you by your health care provider. Make sure you discuss any questions you have with your health care provider.   Document Released: 09/04/2005 Document Revised: 12/29/2014 Document Reviewed: 11/04/2013 Elsevier Interactive Patient Education 2016 [employer] Inc[employer]    Asthma, Adult Asthma is a recurring condition in which the airways tighten  and narrow. Asthma can make it difficult to breathe. It can cause coughing, wheezing, and shortness of breath. Asthma episodes, also called asthma attacks, range from minor to life-threatening. Asthma cannot be cured, but medicines and lifestyle changes can help control it. CAUSES Asthma is believed to be caused by inherited (genetic) and environmental factors, but its exact cause is unknown. Asthma may be triggered by allergens, lung infections, or irritants in the air. Asthma triggers are different for each person. Common triggers include:   Animal dander.  Dust mites.  Cockroaches.  Pollen from trees or grass.  Mold.  Smoke.  Air pollutants such as dust, household cleaners, hair sprays, aerosol sprays, paint fumes, strong chemicals, or strong odors.  Cold air, weather changes, and winds (which increase molds and pollens in the air).  Strong emotional expressions such as crying or laughing hard.  Stress.  Certain medicines (such as aspirin) or types of drugs (such as beta-blockers).  Sulfites in foods and drinks. Foods and drinks that may contain sulfites include dried fruit, potato chips, and sparkling grape juice.  Infections or inflammatory conditions such as the flu, a cold, or an inflammation of the nasal membranes (rhinitis).  Gastroesophageal reflux disease (GERD).  Exercise or strenuous activity. SYMPTOMS Symptoms may occur immediately after asthma is triggered or many hours later. Symptoms include:  Wheezing.  Excessive nighttime or early morning coughing.  Frequent or severe coughing with a common cold.  Chest tightness.  Shortness of breath. DIAGNOSIS  The diagnosis of asthma is made by a review of your medical history and a physical exam. Tests may also be performed. These may include:  Lung function studies. These tests show how much air you breathe in and out.  Allergy tests.  Imaging tests such as X-rays. TREATMENT  Asthma cannot be cured, but it  can usually be controlled. Treatment involves identifying and avoiding your asthma triggers. It  also involves medicines. There are 2 classes of medicine used for asthma treatment:   Controller medicines. These prevent asthma symptoms from occurring. They are usually taken every day.  Reliever or rescue medicines. These quickly relieve asthma symptoms. They are used as needed and provide short-term relief. Your health care provider will help you create an asthma action plan. An asthma action plan is a written plan for managing and treating your asthma attacks. It includes a list of your asthma triggers and how they may be avoided. It also includes information on when medicines should be taken and when their dosage should be changed. An action plan may also involve the use of a device called a peak flow meter. A peak flow meter measures how well the lungs are working. It helps you monitor your condition. HOME CARE INSTRUCTIONS   Take medicines only as directed by your health care provider. Speak with your health care provider if you have questions about how or when to take the medicines.  Use a peak flow meter as directed by your health care provider. Record and keep track of readings.  Understand and use the action plan to help minimize or stop an asthma attack without needing to seek medical care.  Control your home environment in the following ways to help prevent asthma attacks:  Do not smoke. Avoid being exposed to secondhand smoke.  Change your heating and air conditioning filter regularly.  Limit your use of fireplaces and wood stoves.  Get rid of pests (such as roaches and mice) and their droppings.  Throw away plants if you see mold on them.  Clean your floors and dust regularly. Use unscented cleaning products.  Try to have someone else vacuum for you regularly. Stay out of rooms while they are being vacuumed and for a short while afterward. If you vacuum, use a dust mask from a  hardware store, a double-layered or microfilter vacuum cleaner bag, or a vacuum cleaner with a HEPA filter.  Replace carpet with wood, tile, or vinyl flooring. Carpet can trap dander and dust.  Use allergy-proof pillows, mattress covers, and box spring covers.  Wash bed sheets and blankets every week in hot water and dry them in a dryer.  Use blankets that are made of polyester or cotton.  Clean bathrooms and kitchens with bleach. If possible, have someone repaint the walls in these rooms with mold-resistant paint. Keep out of the rooms that are being cleaned and painted.  Wash hands frequently. SEEK MEDICAL CARE IF:   You have wheezing, shortness of breath, or a cough even if taking medicine to prevent attacks.  The colored mucus you cough up (sputum) is thicker than usual.  Your sputum changes from clear or white to yellow, green, gray, or bloody.  You have any problems that may be related to the medicines you are taking (such as a rash, itching, swelling, or trouble breathing).  You are using a reliever medicine more than 2-3 times per week.  Your peak flow is still at 50-79% of your personal best after following your action plan for 1 hour.  You have a fever. SEEK IMMEDIATE MEDICAL CARE IF:   You seem to be getting worse and are unresponsive to treatment during an asthma attack.  You are short of breath even at rest.  You get short of breath when doing very little physical activity.  You have difficulty eating, drinking, or talking due to asthma symptoms.  You develop chest pain.  You develop  a fast heartbeat.  You have a bluish color to your lips or fingernails.  You are light-headed, dizzy, or faint.  Your peak flow is less than 50% of your personal best.   This information is not intended to replace advice given to you by your health care provider. Make sure you discuss any questions you have with your health care provider.   Document Released: 12/08/2005  Document Revised: 08/29/2015 Document Reviewed: 07/07/2013 Elsevier Interactive Patient Education 2016 [employer]    Asthma Attack Prevention While you may not be able to control the fact that you have asthma, you can take actions to prevent asthma attacks. The best way to prevent asthma attacks is to maintain good control of your asthma. You can achieve this by:  Taking your medicines as directed.  Avoiding things that can irritate your airways or make your asthma symptoms worse (asthma triggers).  Keeping track of how well your asthma is controlled and of any changes in your symptoms.  Responding quickly to worsening asthma symptoms (asthma attack).  Seeking emergency care when it is needed. WHAT ARE SOME WAYS TO PREVENT AN ASTHMA ATTACK? Have a Plan Work with your health care provider to create a written plan for managing and treating your asthma attacks (asthma action plan). This plan includes:  A list of your asthma triggers and how you can avoid them.  Information on when medicines should be taken and when their dosages should be changed.  The use of a device that measures how well your lungs are working (peak flow meter). Monitor Your Asthma Use your peak flow meter and record your results in a journal every day. A drop in your peak flow numbers on one or more days may indicate the start of an asthma attack. This can happen even before you start to feel symptoms. You can prevent an asthma attack from getting worse by following the steps in your asthma action plan. Avoid Asthma Triggers Work with your asthma health care provider to find out what your asthma triggers are. This can be done by:  Allergy testing.  Keeping a journal that notes when asthma attacks occur and the factors that may have contributed to them.  Determining if there are other medical conditions that are making your asthma worse. Once you have determined your asthma triggers, take steps to avoid them.  This may include avoiding excessive or prolonged exposure to:  Dust. Have someone dust and vacuum your home for you once or twice a week. Using a high-efficiency particulate arrestance (HEPA) vacuum is best.  Smoke. This includes campfire smoke, forest fire smoke, and secondhand smoke from tobacco products.  Pet dander. Avoid contact with animals that you know you are allergic to.  Allergens from trees, grasses or pollens. Avoid spending a lot of time outdoors when pollen counts are high, and on very windy days.  Very cold, dry, or humid air.  Mold.  Foods that contain high amounts of sulfites.  Strong odors.  Outdoor air pollutants, such as Museum/gallery exhibitions officer.  Indoor air pollutants, such as aerosol sprays and fumes from household cleaners.  Household pests, including dust mites and cockroaches, and pest droppings.  Certain medicines, including NSAIDs. Always talk to your health care provider before stopping or starting any new medicines. Medicines Take over-the-counter and prescription medicines only as told by your health care provider. Many asthma attacks can be prevented by carefully following your medicine schedule. Taking your medicines correctly is especially important when you cannot avoid  certain asthma triggers. Act Quickly If an asthma attack does happen, acting quickly can decrease how severe it is and how long it lasts. Take these steps:   Pay attention to your symptoms. If you are coughing, wheezing, or having difficulty breathing, do not wait to see if your symptoms go away on their own. Follow your asthma action plan.  If you have followed your asthma action plan and your symptoms are not improving, call your health care provider or seek immediate medical care at the nearest hospital. It is important to note how often you need to use your fast-acting rescue inhaler. If you are using your rescue inhaler more often, it may mean that your asthma is not under control.  Adjusting your asthma treatment plan may help you to prevent future asthma attacks and help you to gain better control of your condition. HOW CAN I PREVENT AN ASTHMA ATTACK WHEN I EXERCISE? Follow advice from your health care provider about whether you should use your fast-acting inhaler before exercising. Many people with asthma experience exercise-induced bronchoconstriction (EIB). This condition often worsens during vigorous exercise in cold, humid, or dry environments. Usually, people with EIB can stay very active by pre-treating with a fast-acting inhaler before exercising.   This information is not intended to replace advice given to you by your health care provider. Make sure you discuss any questions you have with your health care provider.   Document Released: 11/26/2009 Document Revised: 08/29/2015 Document Reviewed: 05/10/2015 Elsevier Interactive Patient Education 2016 [employer]   Peripheral Edema You have swelling in your legs (peripheral edema). This swelling is due to excess accumulation of salt and water in your body. Edema may be a sign of heart, kidney or liver disease, or a side effect of a medication. It may also be due to problems in the leg veins. Elevating your legs and using special support stockings may be very helpful, if the cause of the swelling is due to poor venous circulation. Avoid long periods of standing, whatever the cause. Treatment of edema depends on identifying the cause. Chips, pretzels, pickles and other salty foods should be avoided. Restricting salt in your diet is almost always needed. Water pills (diuretics) are often used to remove the excess salt and water from your body via urine. These medicines prevent the kidney from reabsorbing sodium. This increases urine flow. Diuretic treatment may also result in lowering of potassium levels in your body. Potassium supplements may be needed if you have to use diuretics daily. Daily weights can help you keep  track of your progress in clearing your edema. You should call your caregiver for follow up care as recommended. SEEK IMMEDIATE MEDICAL CARE IF:   You have increased swelling, pain, redness, or heat in your legs.  You develop shortness of breath, especially when lying down.  You develop chest or abdominal pain, weakness, or fainting.  You have a fever.   This information is not intended to replace advice given to you by your health care provider. Make sure you discuss any questions you have with your health care provider.   Document Released: 01/15/2005 Document Revised: 03/01/2012 Document Reviewed: 06/20/2015 Else[employer] Interactive Patient Education [employer]

## 2015-10-26 ENCOUNTER — Inpatient Hospital Stay: Payer: Medicare Other | Admitting: Family Medicine

## 2016-04-10 ENCOUNTER — Encounter (HOSPITAL_COMMUNITY): Payer: Self-pay | Admitting: *Deleted

## 2016-04-10 ENCOUNTER — Emergency Department (HOSPITAL_COMMUNITY)
Admission: EM | Admit: 2016-04-10 | Discharge: 2016-04-10 | Disposition: A | Discharge status: Home / Self Care | Payer: Medicare Other | Attending: Emergency Medicine | Admitting: Emergency Medicine

## 2016-04-10 DIAGNOSIS — E669 Obesity, unspecified: Secondary | ICD-10-CM | POA: Insufficient documentation

## 2016-04-10 DIAGNOSIS — Z79899 Other long term (current) drug therapy: Secondary | ICD-10-CM | POA: Diagnosis not present

## 2016-04-10 DIAGNOSIS — E119 Type 2 diabetes mellitus without complications: Secondary | ICD-10-CM | POA: Insufficient documentation

## 2016-04-10 DIAGNOSIS — J4521 Mild intermittent asthma with (acute) exacerbation: Secondary | ICD-10-CM | POA: Diagnosis not present

## 2016-04-10 DIAGNOSIS — F17200 Nicotine dependence, unspecified, uncomplicated: Secondary | ICD-10-CM | POA: Diagnosis not present

## 2016-04-10 DIAGNOSIS — R0602 Shortness of breath: Secondary | ICD-10-CM | POA: Diagnosis present

## 2016-04-10 DIAGNOSIS — F419 Anxiety disorder, unspecified: Secondary | ICD-10-CM | POA: Insufficient documentation

## 2016-04-10 DIAGNOSIS — Z7984 Long term (current) use of oral hypoglycemic drugs: Secondary | ICD-10-CM | POA: Diagnosis not present

## 2016-04-10 MED ORDER — ALBUTEROL SULFATE (2.5 MG/3ML) 0.083% IN NEBU
INHALATION_SOLUTION | RESPIRATORY_TRACT | Status: AC
  Filled 2016-04-10: qty 6

## 2016-04-10 MED ORDER — ALBUTEROL SULFATE HFA 108 (90 BASE) MCG/ACT IN AERS: 1.0000 | INHALATION_SPRAY | Freq: Four times a day (QID) | RESPIRATORY_TRACT | Status: DC | PRN

## 2016-04-10 MED ORDER — PREDNISONE 20 MG PO TABS
60.0000 mg | ORAL_TABLET | Freq: Once | ORAL | Status: AC
  Administered 2016-04-10: 60 mg via ORAL
  Filled 2016-04-10: qty 3

## 2016-04-10 MED ORDER — ALBUTEROL SULFATE (2.5 MG/3ML) 0.083% IN NEBU
5.0000 mg | INHALATION_SOLUTION | Freq: Once | RESPIRATORY_TRACT | Status: AC
  Administered 2016-04-10: 5 mg via RESPIRATORY_TRACT

## 2016-04-10 MED ORDER — PREDNISONE 20 MG PO TABS: 60.0000 mg | ORAL_TABLET | Freq: Every day | ORAL | Status: DC

## 2016-04-10 NOTE — Discharge Instructions (Signed)
Asthma Attack Prevention °While you may not be able to control the fact that you have asthma, you can take actions to prevent asthma attacks. The best way to prevent asthma attacks is to maintain good control of your asthma. You can achieve this by: °· Taking your medicines as directed. °· Avoiding things that can irritate your airways or make your asthma symptoms worse (asthma triggers). °· Keeping track of how well your asthma is controlled and of any changes in your symptoms. °· Responding quickly to worsening asthma symptoms (asthma attack). °· Seeking emergency care when it is needed. °WHAT ARE SOME WAYS TO PREVENT AN ASTHMA ATTACK? °Have a Plan °Work with your health care provider to create a written plan for managing and treating your asthma attacks (asthma action plan). This plan includes: °· A list of your asthma triggers and how you can avoid them. °· Information on when medicines should be taken and when their dosages should be changed. °· The use of a device that measures how well your lungs are working (peak flow meter). °Monitor Your Asthma °Use your peak flow meter and record your results in a journal every day. A drop in your peak flow numbers on one or more days may indicate the start of an asthma attack. This can happen even before you start to feel symptoms. You can prevent an asthma attack from getting worse by following the steps in your asthma action plan. °Avoid Asthma Triggers °Work with your asthma health care provider to find out what your asthma triggers are. This can be done by: °· Allergy testing. °· Keeping a journal that notes when asthma attacks occur and the factors that may have contributed to them. °· Determining if there are other medical conditions that are making your asthma worse. °Once you have determined your asthma triggers, take steps to avoid them. This may include avoiding excessive or prolonged exposure to: °· Dust. Have someone dust and vacuum your home for you once or  twice a week. Using a high-efficiency particulate arrestance (HEPA) vacuum is best. °· Smoke. This includes campfire smoke, forest fire smoke, and secondhand smoke from tobacco products. °· Pet dander. Avoid contact with animals that you know you are allergic to. °· Allergens from trees, grasses or pollens. Avoid spending a lot of time outdoors when pollen counts are high, and on very windy days. °· Very cold, dry, or humid air. °· Mold. °· Foods that contain high amounts of sulfites. °· Strong odors. °· Outdoor air pollutants, such as engine exhaust. °· Indoor air pollutants, such as aerosol sprays and fumes from household cleaners. °· Household pests, including dust mites and cockroaches, and pest droppings. °· Certain medicines, including NSAIDs. Always talk to your health care provider before stopping or starting any new medicines. °Medicines °Take over-the-counter and prescription medicines only as told by your health care provider. Many asthma attacks can be prevented by carefully following your medicine schedule. Taking your medicines correctly is especially important when you cannot avoid certain asthma triggers. °Act Quickly °If an asthma attack does happen, acting quickly can decrease how severe it is and how long it lasts. Take these steps:  °· Pay attention to your symptoms. If you are coughing, wheezing, or having difficulty breathing, do not wait to see if your symptoms go away on their own. Follow your asthma action plan. °· If you have followed your asthma action plan and your symptoms are not improving, call your health care provider or seek immediate medical care   at the nearest hospital. °It is important to note how often you need to use your fast-acting rescue inhaler. If you are using your rescue inhaler more often, it may mean that your asthma is not under control. Adjusting your asthma treatment plan may help you to prevent future asthma attacks and help you to gain better control of your  condition. °HOW CAN I PREVENT AN ASTHMA ATTACK WHEN I EXERCISE? °Follow advice from your health care provider about whether you should use your fast-acting inhaler before exercising. Many people with asthma experience exercise-induced bronchoconstriction (EIB). This condition often worsens during vigorous exercise in cold, humid, or dry environments. Usually, people with EIB can stay very active by pre-treating with a fast-acting inhaler before exercising. °  °This information is not intended to replace advice given to you by your health care provider. Make sure you discuss any questions you have with your health care provider. °  °Document Released: 11/26/2009 Document Revised: 08/29/2015 Document Reviewed: 05/10/2015 °Elsevier Interactive Patient Education ©2016 [employer] ° °Asthma, Adult °Asthma is a recurring condition in which the airways tighten and narrow. Asthma can make it difficult to breathe. It can cause coughing, wheezing, and shortness of breath. Asthma episodes, also called asthma attacks, range from minor to life-threatening. Asthma cannot be cured, but medicines and lifestyle changes can help control it. °CAUSES °Asthma is believed to be caused by inherited (genetic) and environmental factors, but its exact cause is unknown. Asthma may be triggered by allergens, lung infections, or irritants in the air. Asthma triggers are different for each person. Common triggers include:  °· Animal dander. °· Dust mites. °· Cockroaches. °· Pollen from trees or grass. °· Mold. °· Smoke. °· Air pollutants such as dust, household cleaners, hair sprays, aerosol sprays, paint fumes, strong chemicals, or strong odors. °· Cold air, weather changes, and winds (which increase molds and pollens in the air). °· Strong emotional expressions such as crying or laughing hard. °· Stress. °· Certain medicines (such as aspirin) or types of drugs (such as beta-blockers). °· Sulfites in foods and drinks. Foods and drinks that  may contain sulfites include dried fruit, potato chips, and sparkling grape juice. °· Infections or inflammatory conditions such as the flu, a cold, or an inflammation of the nasal membranes (rhinitis). °· Gastroesophageal reflux disease (GERD). °· Exercise or strenuous activity. °SYMPTOMS °Symptoms may occur immediately after asthma is triggered or many hours later. Symptoms include: °· Wheezing. °· Excessive nighttime or early morning coughing. °· Frequent or severe coughing with a common cold. °· Chest tightness. °· Shortness of breath. °DIAGNOSIS  °The diagnosis of asthma is made by a review of your medical history and a physical exam. Tests may also be performed. These may include: °· Lung function studies. These tests show how much air you breathe in and out. °· Allergy tests. °· Imaging tests such as X-rays. °TREATMENT  °Asthma cannot be cured, but it can usually be controlled. Treatment involves identifying and avoiding your asthma triggers. It also involves medicines. There are 2 classes of medicine used for asthma treatment:  °· Controller medicines. These prevent asthma symptoms from occurring. They are usually taken every day. °· Reliever or rescue medicines. These quickly relieve asthma symptoms. They are used as needed and provide short-term relief. °Your health care provider will help you create an asthma action plan. An asthma action plan is a written plan for managing and treating your asthma attacks. It includes a list of your asthma triggers and how they   may be avoided. It also includes information on when medicines should be taken and when their dosage should be changed. An action plan may also involve the use of a device called a peak flow meter. A peak flow meter measures how well the lungs are working. It helps you monitor your condition. °HOME CARE INSTRUCTIONS  °· Take medicines only as directed by your health care provider. Speak with your health care provider if you have questions about  how or when to take the medicines. °· Use a peak flow meter as directed by your health care provider. Record and keep track of readings. °· Understand and use the action plan to help minimize or stop an asthma attack without needing to seek medical care. °· Control your home environment in the following ways to help prevent asthma attacks: °¨ Do not smoke. Avoid being exposed to secondhand smoke. °¨ Change your heating and air conditioning filter regularly. °¨ Limit your use of fireplaces and wood stoves. °¨ Get rid of pests (such as roaches and mice) and their droppings. °¨ Throw away plants if you see mold on them. °¨ Clean your floors and dust regularly. Use unscented cleaning products. °¨ Try to have someone else vacuum for you regularly. Stay out of rooms while they are being vacuumed and for a short while afterward. If you vacuum, use a dust mask from a hardware store, a double-layered or microfilter vacuum cleaner bag, or a vacuum cleaner with a HEPA filter. °¨ Replace carpet with wood, tile, or vinyl flooring. Carpet can trap dander and dust. °¨ Use allergy-proof pillows, mattress covers, and box spring covers. °¨ Wash bed sheets and blankets every week in hot water and dry them in a dryer. °¨ Use blankets that are made of polyester or cotton. °¨ Clean bathrooms and kitchens with bleach. If possible, have someone repaint the walls in these rooms with mold-resistant paint. Keep out of the rooms that are being cleaned and painted. °¨ Wash hands frequently. °SEEK MEDICAL CARE IF:  °· You have wheezing, shortness of breath, or a cough even if taking medicine to prevent attacks. °· The colored mucus you cough up (sputum) is thicker than usual. °· Your sputum changes from clear or white to yellow, green, gray, or bloody. °· You have any problems that may be related to the medicines you are taking (such as a rash, itching, swelling, or trouble breathing). °· You are using a reliever medicine more than 2-3 times per  week. °· Your peak flow is still at 50-79% of your personal best after following your action plan for 1 hour. °· You have a fever. °SEEK IMMEDIATE MEDICAL CARE IF:  °· You seem to be getting worse and are unresponsive to treatment during an asthma attack. °· You are short of breath even at rest. °· You get short of breath when doing very little physical activity. °· You have difficulty eating, drinking, or talking due to asthma symptoms. °· You develop chest pain. °· You develop a fast heartbeat. °· You have a bluish color to your lips or fingernails. °· You are light-headed, dizzy, or faint. °· Your peak flow is less than 50% of your personal best. °  °This information is not intended to replace advice given to you by your health care provider. Make sure you discuss any questions you have with your health care provider. °  °Document Released: 12/08/2005 Document Revised: 08/29/2015 Document Reviewed: 07/07/2013 °Elsevier Interactive Patient Education ©2016 [employer] ° °

## 2016-04-10 NOTE — ED Provider Notes (Signed)
CSN: [id number]     Arrival date & time 04/10/16  1847 History   First MD Initiated Contact with Patient 04/10/16 2035     Chief Complaint  Patient presents with  . Asthma  . Shortness of Breath     (Consider location/radiation/quality/duration/timing/severity/associated sxs/prior Treatment) The history is provided by the patient and medical records.    35 year old female with past medical history of obesity diabetes and mild intermittent asthmatic presents with acute onset of shortness of breath, now resolved. Patient was at work today around [employer] smoke (works at [employer]) when she experienced acute onset SOB with wheezing. Patient states that she has had asthma exacerbations to smoke in the past. She states that she walked outside to get away from it but had persistent wheezing and shortness of breath. She tried to use her inhaler but she did not have the connection to her nebulizer so she was unable to treat herself. She says when presents to the ED for evaluation. On arrival here, patient was given albuterol with immediately for symptoms. She states she feels back to her baseline. Denies any chest pain or shortness of breath at this time. She denies any fevers cough or sputum production. Denies any lower extremity edema. She has no history of DVTs or PEs.   Past Medical History  Diagnosis Date  . Obesity   . Diabetes mellitus without complication (HCC)   . Anxiety   . Asthma    Past Surgical History  Procedure Laterality Date  . Dental surgery     No family history on file. Social History  Substance Use Topics  . Smoking status: Current Every Day Smoker  . Smokeless tobacco: None  . Alcohol Use: Yes   OB History    No data available     Review of Systems  Constitutional: Negative for fever and chills.  HENT: Negative for congestion and rhinorrhea.   Respiratory: Positive for cough, shortness of breath and wheezing.   Cardiovascular: Negative for chest pain and leg  swelling.  Gastrointestinal: Negative for nausea, vomiting, abdominal pain and diarrhea.  Musculoskeletal: Negative for neck pain and neck stiffness.  Skin: Negative for rash.  Allergic/Immunologic: Negative for immunocompromised state.  Neurological: Negative for syncope, weakness and headaches.      Allergies  Lamictal and Bupropion  Home Medications   Prior to Admission medications   Medication Sig Start Date End Date Taking? Authorizing Provider  ALPRAZolam Prudy Feeler(XANAX) 1 MG tablet Take 1 mg by mouth 3 (three) times daily. 03/27/16  Yes Historical Provider, MD  loxapine (LOXITANE) 25 MG capsule Take 50 mg by mouth at bedtime. 09/14/15  Yes Historical Provider, MD  metFORMIN (GLUCOPHAGE) 500 MG tablet Take 2 tablets (1,000 mg total) by mouth 2 (two) times daily. 10/17/15  Yes Cathren LaineKevin Steinl, MD  zolpidem (AMBIEN) 10 MG tablet Take 10 mg by mouth at bedtime. 10/13/15  Yes Historical Provider, MD  albuterol (PROVENTIL HFA;VENTOLIN HFA) 108 (90 Base) MCG/ACT inhaler Inhale 1-2 puffs into the lungs every 6 (six) hours as needed for wheezing or shortness of breath. 04/10/16   Shaune Pollackameron Arrick Dutton, MD  predniSONE (DELTASONE) 20 MG tablet Take 3 tablets (60 mg total) by mouth daily. 04/10/16   Shaune Pollackameron Regis Wiland, MD   Pulse 114  SpO2 99%  LMP 02/22/2016 Physical Exam  Constitutional: She is oriented to person, place, and time. She appears well-developed and well-nourished. No distress.  HENT:  Head: Normocephalic.  Mouth/Throat: No oropharyngeal exudate.  Eyes: Pupils are equal, round, and reactive  to light.  Neck: Normal range of motion. Neck supple.  Cardiovascular: Normal rate, regular rhythm, normal heart sounds and intact distal pulses.  Exam reveals no friction rub.   No murmur heard. Pulmonary/Chest: Effort normal. No respiratory distress. She has wheezes (Faint end expiratory wheezes with deep expiration only). She has no rales.  Abdominal: Soft. Bowel sounds are normal. She exhibits no  distension. There is no tenderness.  Musculoskeletal: She exhibits no edema.  Neurological: She is oriented to person, place, and time.  Skin: Skin is warm. No rash noted.  Nursing note and vitals reviewed.   ED Course  Procedures (including critical care time) Labs Review Labs Reviewed - No data to display  Imaging Review No results found. I have personally reviewed and evaluated these images and lab results as part of my medical decision-making.   EKG Interpretation None      MDM   60 old female with past medical history of mild intermittent asthma who presents with acute onset wheezing and SOB after being exposed to smoke at work. Symptoms completely resolved now after albuterol neb. On arrival, VSS with exception of mild tachycardia, likely 2/2 albuterol. Pt has normal WOB and mild end-expiratory wheezes only. She states she feels back to baseline and just "needed some albuterol" but did not have her inhaler with her. Will give prednisone, albuterol inhaler and d/c home. No other red flag sx. No hypoxia. No focal lung findings, fever, or other sx to suggest PNA. No chest pain or signs of ACS. No LE edema, asymmetry, or signs of DVT/PE. Will discharge home.  Clinical Impression: 1. Asthma, mild intermittent, with acute exacerbation     Disposition: Discharge  Condition: Good  I have discussed the results, Dx and Tx plan with the pt(& family if present). He/she/they expressed understanding and agree(s) with the plan. Discharge instructions discussed at great length. Strict return precautions discussed and pt &/or family have verbalized understanding of the instructions. No further questions at time of discharge.    Discharge Medication List as of 04/10/2016  8:57 PM    START taking these medications   Details  predniSONE (DELTASONE) 20 MG tablet Take 3 tablets (60 mg total) by mouth daily., Starting 04/10/2016, Until Discontinued, Print        Follow Up: [facility] [address] [place] [address] [phone]  Follow-up with your primary care physician in 3-5 days. If you do not have a doctor, you can call this number to set up an appointment.   Pt seen in conjunction with Dr. De Burrs, MD 04/11/16 1157  Eber Hong, MD 04/13/16 2059

## 2016-04-10 NOTE — ED Provider Notes (Signed)
The pt has asthma Out of her meds At work at [employer] with flare up - had no meds Had meds here and feels better Mild exp wheezing no distress Wants d/c Reasonable - has albuterol at home Pt understands indications for f/u.  I saw and evaluated the patient, reviewed the resident's note and I agree with the findings and plan.    Final diagnoses:  Asthma, mild intermittent, with acute exacerbation      Eber HongBrian Reice Bienvenue, MD 04/13/16 2100

## 2016-04-10 NOTE — ED Notes (Signed)
Pt states she works in a [employer] and has asthma. States she is feeling short of breath. Appears short of breath, labored breathing. Pt states she did use her inhaler with no relief.

## 2016-04-27 ENCOUNTER — Encounter (HOSPITAL_COMMUNITY): Payer: Self-pay | Admitting: *Deleted

## 2016-04-27 ENCOUNTER — Emergency Department (HOSPITAL_COMMUNITY): Payer: Medicare Other

## 2016-04-27 ENCOUNTER — Emergency Department (HOSPITAL_COMMUNITY)
Admission: EM | Admit: 2016-04-27 | Discharge: 2016-04-27 | Disposition: A | Discharge status: Home / Self Care | Payer: Medicare Other | Attending: Emergency Medicine | Admitting: Emergency Medicine

## 2016-04-27 DIAGNOSIS — F17200 Nicotine dependence, unspecified, uncomplicated: Secondary | ICD-10-CM | POA: Diagnosis not present

## 2016-04-27 DIAGNOSIS — J45901 Unspecified asthma with (acute) exacerbation: Secondary | ICD-10-CM | POA: Diagnosis not present

## 2016-04-27 DIAGNOSIS — R000 Tachycardia, unspecified: Secondary | ICD-10-CM | POA: Diagnosis not present

## 2016-04-27 DIAGNOSIS — Z3202 Encounter for pregnancy test, result negative: Secondary | ICD-10-CM | POA: Diagnosis not present

## 2016-04-27 DIAGNOSIS — Z7952 Long term (current) use of systemic steroids: Secondary | ICD-10-CM | POA: Diagnosis not present

## 2016-04-27 DIAGNOSIS — Z79899 Other long term (current) drug therapy: Secondary | ICD-10-CM | POA: Diagnosis not present

## 2016-04-27 DIAGNOSIS — E669 Obesity, unspecified: Secondary | ICD-10-CM | POA: Diagnosis not present

## 2016-04-27 DIAGNOSIS — E1165 Type 2 diabetes mellitus with hyperglycemia: Secondary | ICD-10-CM | POA: Insufficient documentation

## 2016-04-27 DIAGNOSIS — R739 Hyperglycemia, unspecified: Secondary | ICD-10-CM

## 2016-04-27 DIAGNOSIS — Z7984 Long term (current) use of oral hypoglycemic drugs: Secondary | ICD-10-CM | POA: Insufficient documentation

## 2016-04-27 DIAGNOSIS — R062 Wheezing: Secondary | ICD-10-CM | POA: Diagnosis present

## 2016-04-27 DIAGNOSIS — F419 Anxiety disorder, unspecified: Secondary | ICD-10-CM | POA: Insufficient documentation

## 2016-04-27 LAB — BASIC METABOLIC PANEL
Anion gap: 13 (ref 5–15)
BUN: <5 mg/dL — ABNORMAL LOW (ref 6–20)
CHLORIDE: 101 mmol/L (ref 101–111)
CO2: 24 mmol/L (ref 22–32)
CREATININE: 0.75 mg/dL (ref 0.44–1.00)
Calcium: 10.1 mg/dL (ref 8.9–10.3)
Glucose, Bld: 213 mg/dL — ABNORMAL HIGH (ref 65–99)
POTASSIUM: 3.7 mmol/L (ref 3.5–5.1)
Sodium: 138 mmol/L (ref 135–145)

## 2016-04-27 LAB — CBG MONITORING, ED
GLUCOSE-CAPILLARY: 207 mg/dL — AB (ref 65–99)
GLUCOSE-CAPILLARY: 198 mg/dL — AB (ref 65–99)

## 2016-04-27 LAB — CBC WITH DIFFERENTIAL/PLATELET
BASOS ABS: 0 10*3/uL (ref 0.0–0.1)
Basophils Relative: 0 %
EOS ABS: 0.4 10*3/uL (ref 0.0–0.7)
EOS PCT: 3 %
HCT: 36.7 % (ref 36.0–46.0)
HEMOGLOBIN: 12.1 g/dL (ref 12.0–15.0)
LYMPHS ABS: 3.7 10*3/uL (ref 0.7–4.0)
LYMPHS PCT: 33 %
MCH: 25.3 pg — AB (ref 26.0–34.0)
MCHC: 33 g/dL (ref 30.0–36.0)
MCV: 76.6 fL — ABNORMAL LOW (ref 78.0–100.0)
Monocytes Absolute: 0.6 10*3/uL (ref 0.1–1.0)
Monocytes Relative: 5 %
NEUTROS PCT: 59 %
Neutro Abs: 6.7 10*3/uL (ref 1.7–7.7)
PLATELETS: 377 10*3/uL (ref 150–400)
RBC: 4.79 MIL/uL (ref 3.87–5.11)
RDW: 14.4 % (ref 11.5–15.5)
WBC: 11.4 10*3/uL — AB (ref 4.0–10.5)

## 2016-04-27 LAB — URINALYSIS, ROUTINE W REFLEX MICROSCOPIC
Bilirubin Urine: NEGATIVE
GLUCOSE, UA: NEGATIVE mg/dL
Hgb urine dipstick: NEGATIVE
KETONES UR: NEGATIVE mg/dL
LEUKOCYTES UA: NEGATIVE
Nitrite: NEGATIVE
PROTEIN: NEGATIVE mg/dL
Specific Gravity, Urine: 1.009 (ref 1.005–1.030)
pH: 5.5 (ref 5.0–8.0)

## 2016-04-27 LAB — POC URINE PREG, ED: PREG TEST UR: NEGATIVE

## 2016-04-27 MED ORDER — IPRATROPIUM BROMIDE 0.02 % IN SOLN
1.0000 mg | Freq: Once | RESPIRATORY_TRACT | Status: AC
  Administered 2016-04-27: 1 mg via RESPIRATORY_TRACT
  Filled 2016-04-27: qty 5

## 2016-04-27 MED ORDER — SODIUM CHLORIDE 0.9 % IV BOLUS (SEPSIS)
1000.0000 mL | Freq: Once | INTRAVENOUS | Status: AC
  Administered 2016-04-27: 1000 mL via INTRAVENOUS

## 2016-04-27 MED ORDER — PREDNISONE 20 MG PO TABS
60.0000 mg | ORAL_TABLET | Freq: Once | ORAL | Status: AC
  Administered 2016-04-27: 60 mg via ORAL
  Filled 2016-04-27: qty 3

## 2016-04-27 MED ORDER — PREDNISONE 20 MG PO TABS
60.0000 mg | ORAL_TABLET | Freq: Every day | ORAL | Status: AC
Start: 2016-04-28 — End: 2016-05-01

## 2016-04-27 MED ORDER — IPRATROPIUM BROMIDE 0.02 % IN SOLN: 0.5000 mg | Freq: Once | RESPIRATORY_TRACT | Status: DC

## 2016-04-27 MED ORDER — ALBUTEROL SULFATE HFA 108 (90 BASE) MCG/ACT IN AERS: 1.0000 | INHALATION_SPRAY | RESPIRATORY_TRACT | Status: DC | PRN

## 2016-04-27 MED ORDER — ALBUTEROL (5 MG/ML) CONTINUOUS INHALATION SOLN
15.0000 mg/h | INHALATION_SOLUTION | Freq: Once | RESPIRATORY_TRACT | Status: AC
  Administered 2016-04-27: 15 mg/h via RESPIRATORY_TRACT
  Filled 2016-04-27: qty 20

## 2016-04-27 MED ORDER — ALBUTEROL SULFATE HFA 108 (90 BASE) MCG/ACT IN AERS
2.0000 | INHALATION_SPRAY | Freq: Once | RESPIRATORY_TRACT | Status: AC
  Administered 2016-04-27: 2 via RESPIRATORY_TRACT
  Filled 2016-04-27: qty 6.7

## 2016-04-27 MED ORDER — ALBUTEROL SULFATE (2.5 MG/3ML) 0.083% IN NEBU: 5.0000 mg | INHALATION_SOLUTION | Freq: Once | RESPIRATORY_TRACT | Status: DC

## 2016-04-27 NOTE — ED Provider Notes (Signed)
CSN: [id number]     Arrival date & time    History   First MD Initiated Contact with Patient 04/27/16 0743     No chief complaint on file.    (Consider location/radiation/quality/duration/timing/severity/associated sxs/prior Treatment) HPI  35 year old female with a history of diabetes and asthma presents with shortness of breath for 2 Music. Was seen here a couple Janik ago states she felt better for a couple days and then has had worsening, constant shortness of breath with wheezing. Over the last 5 days has had chest tightness. Chest tightness is similar to when ever she has had more severe asthma exacerbations. No fevers but has had a cough with productive green sputum. She states she always gets green sputum whenever she has asthma flares. Currently her asthma inhaler is not working. She states she feels like she be much better she had a single breathing treatment.  Patient also is asking for us to check her glucose. She states it has been running in the 500s for the past 2 days and she's been having hand tingling which usually comes with hyperglycemia for her.   Past Medical History  Diagnosis Date  . Obesity   . Diabetes mellitus without complication (HCC)   . Anxiety   . Asthma    Past Surgical History  Procedure Laterality Date  . Dental surgery     No family history on file. Social History  Substance Use Topics  . Smoking status: Current Every Day Smoker  . Smokeless tobacco: Not on file  . Alcohol Use: Yes   OB History    No data available     Review of Systems  Constitutional: Negative for fever.  Respiratory: Positive for cough, chest tightness, shortness of breath and wheezing.   All other systems reviewed and are negative.     Allergies  Lamictal and Bupropion  Home Medications   Prior to Admission medications   Medication Sig Start Date End Date Taking? Authorizing Provider  albuterol (PROVENTIL HFA;VENTOLIN HFA) 108 (90 Base) MCG/ACT inhaler Inhale  1-2 puffs into the lungs every 6 (six) hours as needed for wheezing or shortness of breath. 04/10/16   Shaune Pollackameron Isaacs, MD  ALPRAZolam Prudy Feeler(XANAX) 1 MG tablet Take 1 mg by mouth 3 (three) times daily. 03/27/16   Historical Provider, MD  loxapine (LOXITANE) 25 MG capsule Take 50 mg by mouth at bedtime. 09/14/15   Historical Provider, MD  metFORMIN (GLUCOPHAGE) 500 MG tablet Take 2 tablets (1,000 mg total) by mouth 2 (two) times daily. 10/17/15   Cathren LaineKevin Steinl, MD  predniSONE (DELTASONE) 20 MG tablet Take 3 tablets (60 mg total) by mouth daily. 04/10/16   Shaune Pollackameron Isaacs, MD  zolpidem (AMBIEN) 10 MG tablet Take 10 mg by mouth at bedtime. 10/13/15   Historical Provider, MD   BP 117/99 mmHg  Pulse 119  Resp 24  SpO2 96%  LMP 02/22/2016 Physical Exam  Constitutional: She is oriented to person, place, and time. She appears well-developed and well-nourished. No distress.  Obese, no distress  HENT:  Head: Normocephalic and atraumatic.  Right Ear: External ear normal.  Left Ear: External ear normal.  Nose: Nose normal.  Eyes: Right eye exhibits no discharge. Left eye exhibits no discharge.  Cardiovascular: Regular rhythm and normal heart sounds.  Tachycardia present.   Pulmonary/Chest: Effort normal. Tachypnea noted. No respiratory distress. She has decreased breath sounds. She has wheezes.  Abdominal: Soft. There is no tenderness.  Neurological: She is alert and oriented to person, place, and  time.  Skin: Skin is warm. She is not diaphoretic.  Nursing note and vitals reviewed.   ED Course  Procedures (including critical care time) Labs Review Labs Reviewed  BASIC METABOLIC PANEL - Abnormal; Notable for the following:    Glucose, Bld 213 (*)    BUN <5 (*)    All other components within normal limits  CBC WITH DIFFERENTIAL/PLATELET - Abnormal; Notable for the following:    WBC 11.4 (*)    MCV 76.6 (*)    MCH 25.3 (*)    All other components within normal limits  URINALYSIS, ROUTINE W REFLEX  MICROSCOPIC (NOT AT [facility])  CBG MONITORING, ED  POC URINE PREG, ED    Imaging Review Dg Chest 2 View  04/27/2016  CLINICAL DATA:  Hyperglycemia.  History of asthma EXAM: CHEST  2 VIEW COMPARISON:  October 17, 2015 FINDINGS: No edema or consolidation. Heart size and pulmonary vascularity are within normal limits. No adenopathy. No bone lesions. IMPRESSION: No edema or consolidation. Electronically Signed   By: Bretta Bang III M.D.   On: 04/27/2016 09:48   I have personally reviewed and evaluated these images and lab results as part of my medical decision-making.   EKG Interpretation   Date/Time:  Sunday Apr 27 2016 07:42:35 EDT Ventricular Rate:  120 PR Interval:  140 QRS Duration: 84 QT Interval:  348 QTC Calculation: 492 R Axis:   9 Text Interpretation:  Sinus tachycardia Consider anterior infarct Baseline  wander in lead(s) V2 no significant change since Oct 2016 Confirmed by  Criss Alvine MD, Darice Vicario [phone]) on 04/27/2016 7:54:14 AM Also confirmed by  Criss Alvine MD, Rocko Fesperman [phone]), editor Stout CT, Jola Babinski [phone])  on 04/27/2016  9:54:40 AM      MDM   Final diagnoses:  Asthma exacerbation  Hyperglycemia    Patient feels much better after continuous albuterol treatment. She was also given prednisone. She remarks that her glucoses been over 500 last couple days but it is only mildly elevated here. Patient given IV fluids but did not want to stay for further fluids and wants to be discharged. She does have diabetes at think prednisone is still indicated given the severity of her initial asthma exacerbation. She feels well enough for discharge. Discussed return precautions. No signs of an acute bacterial illness.    Pricilla Loveless, MD 04/27/16 [phone]

## 2016-04-27 NOTE — ED Notes (Signed)
Patient transported to X-ray 

## 2016-04-27 NOTE — ED Notes (Signed)
RT at bedside.

## 2016-04-27 NOTE — ED Notes (Signed)
MD Goldston at bedside  

## 2016-04-27 NOTE — ED Notes (Signed)
Pt presents via POV c/o asthma flare up.  Pt reports working around smoke and this triggers her asthma.  Pt a x 4, seems short of breath when speaking, wheezing noted.  Airway intact.

## 2016-04-27 NOTE — ED Notes (Signed)
MD at bedside. 

## 2016-05-01 ENCOUNTER — Inpatient Hospital Stay (HOSPITAL_COMMUNITY)
Admission: EM | Admit: 2016-05-01 | Discharge: 2016-05-03 | DRG: 202 | Discharge status: Against Medical Advice | Payer: Medicare Other | Attending: Internal Medicine | Admitting: Internal Medicine

## 2016-05-01 ENCOUNTER — Emergency Department (HOSPITAL_COMMUNITY): Payer: Medicare Other

## 2016-05-01 ENCOUNTER — Encounter (HOSPITAL_COMMUNITY): Payer: Self-pay | Admitting: Emergency Medicine

## 2016-05-01 DIAGNOSIS — E0865 Diabetes mellitus due to underlying condition with hyperglycemia: Secondary | ICD-10-CM | POA: Diagnosis not present

## 2016-05-01 DIAGNOSIS — T380X6A Underdosing of glucocorticoids and synthetic analogues, initial encounter: Secondary | ICD-10-CM | POA: Diagnosis present

## 2016-05-01 DIAGNOSIS — Z23 Encounter for immunization: Secondary | ICD-10-CM

## 2016-05-01 DIAGNOSIS — F419 Anxiety disorder, unspecified: Secondary | ICD-10-CM | POA: Diagnosis present

## 2016-05-01 DIAGNOSIS — F17200 Nicotine dependence, unspecified, uncomplicated: Secondary | ICD-10-CM | POA: Diagnosis not present

## 2016-05-01 DIAGNOSIS — J9601 Acute respiratory failure with hypoxia: Secondary | ICD-10-CM | POA: Diagnosis present

## 2016-05-01 DIAGNOSIS — G9340 Encephalopathy, unspecified: Secondary | ICD-10-CM | POA: Diagnosis not present

## 2016-05-01 DIAGNOSIS — J45901 Unspecified asthma with (acute) exacerbation: Principal | ICD-10-CM | POA: Insufficient documentation

## 2016-05-01 DIAGNOSIS — E872 Acidosis: Secondary | ICD-10-CM | POA: Diagnosis not present

## 2016-05-01 DIAGNOSIS — Y929 Unspecified place or not applicable: Secondary | ICD-10-CM | POA: Diagnosis not present

## 2016-05-01 DIAGNOSIS — Z79899 Other long term (current) drug therapy: Secondary | ICD-10-CM | POA: Diagnosis not present

## 2016-05-01 DIAGNOSIS — R739 Hyperglycemia, unspecified: Secondary | ICD-10-CM

## 2016-05-01 DIAGNOSIS — R0602 Shortness of breath: Secondary | ICD-10-CM

## 2016-05-01 DIAGNOSIS — J4531 Mild persistent asthma with (acute) exacerbation: Secondary | ICD-10-CM | POA: Diagnosis not present

## 2016-05-01 DIAGNOSIS — J4552 Severe persistent asthma with status asthmaticus: Secondary | ICD-10-CM

## 2016-05-01 DIAGNOSIS — R6510 Systemic inflammatory response syndrome (SIRS) of non-infectious origin without acute organ dysfunction: Secondary | ICD-10-CM | POA: Diagnosis present

## 2016-05-01 DIAGNOSIS — E1165 Type 2 diabetes mellitus with hyperglycemia: Secondary | ICD-10-CM | POA: Diagnosis not present

## 2016-05-01 DIAGNOSIS — J9600 Acute respiratory failure, unspecified whether with hypoxia or hypercapnia: Secondary | ICD-10-CM | POA: Diagnosis present

## 2016-05-01 DIAGNOSIS — Z6841 Body Mass Index (BMI) 40.0 and over, adult: Secondary | ICD-10-CM | POA: Diagnosis not present

## 2016-05-01 DIAGNOSIS — Z7984 Long term (current) use of oral hypoglycemic drugs: Secondary | ICD-10-CM | POA: Diagnosis not present

## 2016-05-01 DIAGNOSIS — D72829 Elevated white blood cell count, unspecified: Secondary | ICD-10-CM | POA: Diagnosis present

## 2016-05-01 DIAGNOSIS — D649 Anemia, unspecified: Secondary | ICD-10-CM | POA: Diagnosis present

## 2016-05-01 DIAGNOSIS — R4182 Altered mental status, unspecified: Secondary | ICD-10-CM | POA: Diagnosis present

## 2016-05-01 DIAGNOSIS — E669 Obesity, unspecified: Secondary | ICD-10-CM | POA: Diagnosis not present

## 2016-05-01 DIAGNOSIS — Z91128 Patient's intentional underdosing of medication regimen for other reason: Secondary | ICD-10-CM

## 2016-05-01 LAB — I-STAT ARTERIAL BLOOD GAS, ED
ACID-BASE DEFICIT: 6 mmol/L — AB (ref 0.0–2.0)
Acid-base deficit: 7 mmol/L — ABNORMAL HIGH (ref 0.0–2.0)
BICARBONATE: 19.1 meq/L — AB (ref 20.0–24.0)
Bicarbonate: 20.4 mEq/L (ref 20.0–24.0)
O2 Saturation: 88 %
O2 Saturation: 94 %
PH ART: 7.268 — AB (ref 7.350–7.450)
PO2 ART: 77 mmHg — AB (ref 80.0–100.0)
Patient temperature: 97.8
TCO2: 22 mmol/L (ref 0–100)
TCO2: 20 mmol/L (ref 0–100)
pCO2 arterial: 44.6 mmHg (ref 35.0–45.0)
pCO2 arterial: 38.5 mmHg (ref 35.0–45.0)
pH, Arterial: 7.301 — ABNORMAL LOW (ref 7.350–7.450)
pO2, Arterial: 62 mmHg — ABNORMAL LOW (ref 80.0–100.0)

## 2016-05-01 LAB — I-STAT CG4 LACTIC ACID, ED
LACTIC ACID, VENOUS: 6.92 mmol/L — AB (ref 0.5–2.0)
Lactic Acid, Venous: 8.04 mmol/L (ref 0.5–2.0)

## 2016-05-01 LAB — CREATININE, SERUM
Creatinine, Ser: 0.92 mg/dL (ref 0.44–1.00)
GFR calc Af Amer: >60 mL/min (ref >60)
GFR calc non Af Amer: >60 mL/min (ref >60)

## 2016-05-01 LAB — RAPID URINE DRUG SCREEN, HOSP PERFORMED
AMPHETAMINES: NOT DETECTED
BARBITURATES: NOT DETECTED
Benzodiazepines: POSITIVE — AB
Cocaine: NOT DETECTED
OPIATES: NOT DETECTED
TETRAHYDROCANNABINOL: NOT DETECTED

## 2016-05-01 LAB — BASIC METABOLIC PANEL
ANION GAP: 14 (ref 5–15)
Anion gap: 15 (ref 5–15)
BUN: 8 mg/dL (ref 6–20)
BUN: 7 mg/dL (ref 6–20)
CHLORIDE: 106 mmol/L (ref 101–111)
CO2: 22 mmol/L (ref 22–32)
CO2: 18 mmol/L — ABNORMAL LOW (ref 22–32)
Calcium: 8.8 mg/dL — ABNORMAL LOW (ref 8.9–10.3)
Calcium: 8.3 mg/dL — ABNORMAL LOW (ref 8.9–10.3)
Chloride: 101 mmol/L (ref 101–111)
Creatinine, Ser: 0.89 mg/dL (ref 0.44–1.00)
Creatinine, Ser: 0.87 mg/dL (ref 0.44–1.00)
GFR calc Af Amer: >60 mL/min (ref >60)
GFR calc non Af Amer: >60 mL/min (ref >60)
GLUCOSE: 316 mg/dL — AB (ref 65–99)
Glucose, Bld: 295 mg/dL — ABNORMAL HIGH (ref 65–99)
POTASSIUM: 4.2 mmol/L (ref 3.5–5.1)
Potassium: 3.6 mmol/L (ref 3.5–5.1)
SODIUM: 139 mmol/L (ref 135–145)
Sodium: 137 mmol/L (ref 135–145)

## 2016-05-01 LAB — URINE MICROSCOPIC-ADD ON: BACTERIA UA: NONE SEEN

## 2016-05-01 LAB — TSH: TSH: 0.418 u[IU]/mL (ref 0.350–4.500)

## 2016-05-01 LAB — CBG MONITORING, ED
GLUCOSE-CAPILLARY: 286 mg/dL — AB (ref 65–99)
GLUCOSE-CAPILLARY: 310 mg/dL — AB (ref 65–99)
GLUCOSE-CAPILLARY: 385 mg/dL — AB (ref 65–99)
Glucose-Capillary: 338 mg/dL — ABNORMAL HIGH (ref 65–99)
Glucose-Capillary: 315 mg/dL — ABNORMAL HIGH (ref 65–99)
Glucose-Capillary: 294 mg/dL — ABNORMAL HIGH (ref 65–99)

## 2016-05-01 LAB — CBC WITH DIFFERENTIAL/PLATELET
BASOS ABS: 0 10*3/uL (ref 0.0–0.1)
Basophils Relative: 0 %
EOS PCT: 0 %
Eosinophils Absolute: 0 10*3/uL (ref 0.0–0.7)
HEMATOCRIT: 31.3 % — AB (ref 36.0–46.0)
Hemoglobin: 10.5 g/dL — ABNORMAL LOW (ref 12.0–15.0)
LYMPHS ABS: 1.9 10*3/uL (ref 0.7–4.0)
LYMPHS PCT: 14 %
MCH: 26.1 pg (ref 26.0–34.0)
MCHC: 33.5 g/dL (ref 30.0–36.0)
MCV: 77.7 fL — AB (ref 78.0–100.0)
Monocytes Absolute: 0.9 10*3/uL (ref 0.1–1.0)
Monocytes Relative: 7 %
NEUTROS ABS: 10.8 10*3/uL — AB (ref 1.7–7.7)
Neutrophils Relative %: 79 %
PLATELETS: 366 10*3/uL (ref 150–400)
RBC: 4.03 MIL/uL (ref 3.87–5.11)
RDW: 14.9 % (ref 11.5–15.5)
WBC: 13.7 10*3/uL — ABNORMAL HIGH (ref 4.0–10.5)

## 2016-05-01 LAB — PHOSPHORUS: PHOSPHORUS: 3.8 mg/dL (ref 2.5–4.6)

## 2016-05-01 LAB — URINALYSIS, ROUTINE W REFLEX MICROSCOPIC
Bilirubin Urine: NEGATIVE
Hgb urine dipstick: NEGATIVE
Ketones, ur: NEGATIVE mg/dL
LEUKOCYTES UA: NEGATIVE
Nitrite: NEGATIVE
PH: 6 (ref 5.0–8.0)
Protein, ur: NEGATIVE mg/dL
Specific Gravity, Urine: 1.024 (ref 1.005–1.030)

## 2016-05-01 LAB — MAGNESIUM: MAGNESIUM: 2.3 mg/dL (ref 1.7–2.4)

## 2016-05-01 LAB — LACTIC ACID, PLASMA
LACTIC ACID, VENOUS: 6.9 mmol/L — AB (ref 0.5–2.0)
LACTIC ACID, VENOUS: 6.1 mmol/L — AB (ref 0.5–2.0)
LACTIC ACID, VENOUS: 4.7 mmol/L — AB (ref 0.5–2.0)
Lactic Acid, Venous: 5.8 mmol/L (ref 0.5–2.0)

## 2016-05-01 LAB — GLUCOSE, CAPILLARY
GLUCOSE-CAPILLARY: 379 mg/dL — AB (ref 65–99)
GLUCOSE-CAPILLARY: 273 mg/dL — AB (ref 65–99)
Glucose-Capillary: 292 mg/dL — ABNORMAL HIGH (ref 65–99)

## 2016-05-01 LAB — PROCALCITONIN

## 2016-05-01 LAB — TROPONIN I: TROPONIN I: 0.04 ng/mL — AB (ref <0.031)

## 2016-05-01 LAB — BRAIN NATRIURETIC PEPTIDE: B Natriuretic Peptide: 157.6 pg/mL — ABNORMAL HIGH (ref 0.0–100.0)

## 2016-05-01 LAB — POC URINE PREG, ED: Preg Test, Ur: NEGATIVE

## 2016-05-01 LAB — MRSA PCR SCREENING: MRSA by PCR: NEGATIVE

## 2016-05-01 MED ORDER — INSULIN ASPART 100 UNIT/ML ~~LOC~~ SOLN
0.0000 [IU] | SUBCUTANEOUS | Status: DC
  Administered 2016-05-01: 20 [IU] via SUBCUTANEOUS
  Administered 2016-05-01 (×2): 15 [IU] via SUBCUTANEOUS
  Filled 2016-05-01 (×3): qty 1

## 2016-05-01 MED ORDER — ALBUTEROL SULFATE (2.5 MG/3ML) 0.083% IN NEBU
INHALATION_SOLUTION | RESPIRATORY_TRACT | Status: AC
  Filled 2016-05-01: qty 6

## 2016-05-01 MED ORDER — IPRATROPIUM-ALBUTEROL 0.5-2.5 (3) MG/3ML IN SOLN
3.0000 mL | Freq: Once | RESPIRATORY_TRACT | Status: AC
  Administered 2016-05-01: 3 mL via RESPIRATORY_TRACT
  Filled 2016-05-01: qty 3

## 2016-05-01 MED ORDER — SODIUM CHLORIDE 0.9 % IV BOLUS (SEPSIS)
1000.0000 mL | Freq: Once | INTRAVENOUS | Status: AC
  Administered 2016-05-01: 1000 mL via INTRAVENOUS

## 2016-05-01 MED ORDER — SODIUM CHLORIDE 0.9 % IV SOLN: 250.0000 mL | INTRAVENOUS | Status: DC | PRN

## 2016-05-01 MED ORDER — ALBUTEROL (5 MG/ML) CONTINUOUS INHALATION SOLN: 10.0000 mg/h | INHALATION_SOLUTION | RESPIRATORY_TRACT | Status: AC

## 2016-05-01 MED ORDER — HEPARIN SODIUM (PORCINE) 5000 UNIT/ML IJ SOLN
5000.0000 [IU] | Freq: Three times a day (TID) | INTRAMUSCULAR | Status: DC
  Administered 2016-05-01 – 2016-05-03 (×6): 5000 [IU] via SUBCUTANEOUS
  Filled 2016-05-01 (×8): qty 1

## 2016-05-01 MED ORDER — SODIUM CHLORIDE 0.9 % IV SOLN
INTRAVENOUS | Status: DC
  Administered 2016-05-01: 2.1 [IU]/h via INTRAVENOUS
  Filled 2016-05-01 (×2): qty 2.5

## 2016-05-01 MED ORDER — IPRATROPIUM-ALBUTEROL 0.5-2.5 (3) MG/3ML IN SOLN
3.0000 mL | Freq: Four times a day (QID) | RESPIRATORY_TRACT | Status: DC
Start: 2016-05-01 — End: 2016-05-02
  Administered 2016-05-01 – 2016-05-02 (×4): 3 mL via RESPIRATORY_TRACT
  Filled 2016-05-01 (×5): qty 3

## 2016-05-01 MED ORDER — ALBUTEROL (5 MG/ML) CONTINUOUS INHALATION SOLN
10.0000 mg/h | INHALATION_SOLUTION | RESPIRATORY_TRACT | Status: DC
  Administered 2016-05-01: 10 mg/h via RESPIRATORY_TRACT
  Filled 2016-05-01: qty 20

## 2016-05-01 MED ORDER — METHYLPREDNISOLONE SODIUM SUCC 125 MG IJ SOLR
125.0000 mg | Freq: Once | INTRAMUSCULAR | Status: AC
Start: 2016-05-01 — End: 2016-05-01
  Administered 2016-05-01: 125 mg via INTRAVENOUS
  Filled 2016-05-01: qty 2

## 2016-05-01 MED ORDER — SODIUM CHLORIDE 0.9 % IV SOLN
Freq: Once | INTRAVENOUS | Status: AC
  Administered 2016-05-01: 10:00:00 via INTRAVENOUS

## 2016-05-01 MED ORDER — IPRATROPIUM-ALBUTEROL 0.5-2.5 (3) MG/3ML IN SOLN
3.0000 mL | Freq: Once | RESPIRATORY_TRACT | Status: AC
Start: 2016-05-01 — End: 2016-05-01
  Administered 2016-05-01: 3 mL via RESPIRATORY_TRACT
  Filled 2016-05-01: qty 3

## 2016-05-01 MED ORDER — DEXAMETHASONE 4 MG PO TABS
10.0000 mg | ORAL_TABLET | Freq: Once | ORAL | Status: AC
  Administered 2016-05-01: 10 mg via ORAL
  Filled 2016-05-01: qty 3

## 2016-05-01 MED ORDER — INSULIN GLARGINE 100 UNIT/ML ~~LOC~~ SOLN
15.0000 [IU] | Freq: Every day | SUBCUTANEOUS | Status: DC
  Filled 2016-05-01: qty 0.15

## 2016-05-01 MED ORDER — METHYLPREDNISOLONE SODIUM SUCC 125 MG IJ SOLR
60.0000 mg | Freq: Three times a day (TID) | INTRAMUSCULAR | Status: DC
  Administered 2016-05-01 – 2016-05-02 (×2): 60 mg via INTRAVENOUS
  Filled 2016-05-01 (×2): qty 2
  Filled 2016-05-01: qty 0.96

## 2016-05-01 MED ORDER — LEVOFLOXACIN IN D5W 750 MG/150ML IV SOLN
750.0000 mg | INTRAVENOUS | Status: DC
  Administered 2016-05-01: 750 mg via INTRAVENOUS
  Filled 2016-05-01 (×2): qty 150

## 2016-05-01 MED ORDER — ALBUTEROL SULFATE (2.5 MG/3ML) 0.083% IN NEBU
5.0000 mg | INHALATION_SOLUTION | Freq: Once | RESPIRATORY_TRACT | Status: AC
  Administered 2016-05-01: 5 mg via RESPIRATORY_TRACT

## 2016-05-01 MED ORDER — ONDANSETRON HCL 4 MG/2ML IJ SOLN: 4.0000 mg | Freq: Four times a day (QID) | INTRAMUSCULAR | Status: DC | PRN

## 2016-05-01 MED ORDER — SODIUM CHLORIDE 0.9 % IV SOLN
INTRAVENOUS | Status: DC
Start: 2016-05-01 — End: 2016-05-02
  Administered 2016-05-01: 11:00:00 via INTRAVENOUS

## 2016-05-01 MED ORDER — ALBUTEROL SULFATE (2.5 MG/3ML) 0.083% IN NEBU
2.5000 mg | INHALATION_SOLUTION | RESPIRATORY_TRACT | Status: DC | PRN
  Administered 2016-05-01: 2.5 mg via RESPIRATORY_TRACT
  Filled 2016-05-01: qty 3

## 2016-05-01 MED ORDER — ALBUTEROL (5 MG/ML) CONTINUOUS INHALATION SOLN
10.0000 mg/h | INHALATION_SOLUTION | Freq: Once | RESPIRATORY_TRACT | Status: AC
  Administered 2016-05-01: 10 mg/h via RESPIRATORY_TRACT

## 2016-05-01 MED ORDER — MAGNESIUM SULFATE 2 GM/50ML IV SOLN
2.0000 g | Freq: Once | INTRAVENOUS | Status: AC
  Administered 2016-05-01: 2 g via INTRAVENOUS
  Filled 2016-05-01: qty 50

## 2016-05-01 NOTE — ED Notes (Signed)
Pt. reports wheezing with productive cough and chest congestion onset this week unrelieved by her inhaler and nebulizer treatment at home . Denies fever .

## 2016-05-01 NOTE — Progress Notes (Signed)
eLink Physician-Brief Progress Note Patient Name: Norma SchlatterCandice Rogers DOB: 1981-01-25 MRN: [id number]   Date of Service  05/01/2016  HPI/Events of Note  CBG (last 3)   Recent Labs  05/01/16 1745 05/01/16 1916 05/01/16 2016  GLUCAP 294* 385* 379*     Already on resistant SSI.  On solumedrol.   eICU Interventions  Change to ICU hyperglycemia 2 with insulin gtt.     Intervention Category Major Interventions: Other:  [name] 05/01/2016, 9:32 PM

## 2016-05-01 NOTE — Progress Notes (Signed)
RT NOTE:  Pt had Albuterol CAT started @ 0521 by RT, RN stopped CAT @ 0529. RN then admin DuoNeb x 2 (0534; 0540).  At 0630 RN called and stated the MD had entered a new Albuterol CAT order.  RT instructed the RN put the remainder of the Albuterol CAT that was removed @ 0529 on the patient @ 0640. If another treatment needed afterward please call RT. Order currently on hold.

## 2016-05-01 NOTE — ED Notes (Signed)
Ordered carb modified dinner tray.  

## 2016-05-01 NOTE — ED Provider Notes (Signed)
CSN: [id number]     Arrival date & time 05/01/16  0217 History   First MD Initiated Contact with Patient 05/01/16 [phone]     Chief Complaint  Patient presents with  . Wheezing  . Cough     (Consider location/radiation/quality/duration/timing/severity/associated sxs/prior Treatment) HPI Comments: 35 year old female with past medical history including type 2 diabetes mellitus, asthma who presents with wheezing and cough. She reports a one-week history of wheezing and dry cough as well as chest congestion. She states that this feels like her usual asthma exacerbation. She has been using her inhaler at home but does not currently have albuterol to use in her nebulizer. No fevers or vomiting. She initially stated that she had not taken steroids for over 1 week but later when I asked about visit on 5/7, she stated that she was given steroids but stopped taking them a few days ago because she was feeling better. She notes hyperglycemia at home and does admit to noncompliance with metformin because she has not been feeling well. She does report some improvement after receiving DuoNeb in triage.  Patient is a 35 y.o. female presenting with wheezing and cough. The history is provided by the patient.  Wheezing Associated symptoms: cough   Cough Associated symptoms: wheezing     Past Medical History  Diagnosis Date  . Obesity   . Diabetes mellitus without complication (HCC)   . Anxiety   . Asthma    Past Surgical History  Procedure Laterality Date  . Dental surgery     No family history on file. Social History  Substance Use Topics  . Smoking status: Current Every Day Smoker  . Smokeless tobacco: None  . Alcohol Use: Yes   OB History    No data available     Review of Systems  Respiratory: Positive for cough and wheezing.    10 Systems reviewed and are negative for acute change except as noted in the HPI.    Allergies  Lamictal and Bupropion  Home Medications   Prior to  Admission medications   Medication Sig Start Date End Date Taking? Authorizing Provider  ALPRAZolam Prudy Feeler) 1 MG tablet Take 1 mg by mouth 3 (three) times daily. 03/27/16  Yes Historical Provider, MD  loxapine (LOXITANE) 25 MG capsule Take 50 mg by mouth at bedtime. 09/14/15  Yes Historical Provider, MD  metFORMIN (GLUCOPHAGE) 500 MG tablet Take 2 tablets (1,000 mg total) by mouth 2 (two) times daily. 10/17/15  Yes Cathren Laine, MD  zolpidem (AMBIEN) 10 MG tablet Take 10 mg by mouth at bedtime. 10/13/15  Yes Historical Provider, MD  albuterol (PROVENTIL HFA;VENTOLIN HFA) 108 (90 Base) MCG/ACT inhaler Inhale 1-2 puffs into the lungs every 4 (four) hours as needed for wheezing or shortness of breath. Patient not taking: Reported on 05/01/2016 04/27/16   Pricilla Loveless, MD  predniSONE (DELTASONE) 20 MG tablet Take 3 tablets (60 mg total) by mouth daily. Patient not taking: Reported on 05/01/2016 04/28/16 05/01/16  Pricilla Loveless, MD   BP 132/73 mmHg  Pulse 106  Temp(Src) 97.8 F (36.6 C) (Oral)  Resp 22  Ht  (1.549 m)  Wt 318 lb 8 oz (144.471 kg)  BMI 60.21 kg/m2  SpO2 98%  LMP 03/24/2016 Physical Exam  Constitutional: She is oriented to person, place, and time. She appears well-developed and well-nourished. No distress.  Dyspneic but non-toxic  HENT:  Head: Normocephalic and atraumatic.  Mildly dry mucous membranes  Eyes: Conjunctivae are normal. Pupils are equal, round,  and reactive to light.  Neck: Neck supple.  Cardiovascular: Regular rhythm and normal heart sounds.  Tachycardia present.   No murmur heard. Pulmonary/Chest: No respiratory distress. She has wheezes.  Tachypnea w/ b/l wheezes and mildly increased WOB  Abdominal: Soft. Bowel sounds are normal. She exhibits no distension. There is no tenderness.  Musculoskeletal: She exhibits no edema.  Neurological: She is alert and oriented to person, place, and time.  Fluent speech  Skin: Skin is warm and dry.  Psychiatric: She has a  normal mood and affect. Judgment normal.  Nursing note and vitals reviewed.   ED Course  Procedures (including critical care time) Labs Review Labs Reviewed  CBC WITH DIFFERENTIAL/PLATELET - Abnormal; Notable for the following:    WBC 13.7 (*)    Hemoglobin 10.5 (*)    HCT 31.3 (*)    MCV 77.7 (*)    Neutro Abs 10.8 (*)    All other components within normal limits  BASIC METABOLIC PANEL - Abnormal; Notable for the following:    Glucose, Bld 316 (*)    Calcium 8.8 (*)    All other components within normal limits  URINALYSIS, ROUTINE W REFLEX MICROSCOPIC (NOT AT [facility]) - Abnormal; Notable for the following:    Glucose, UA >1000 (*)    All other components within normal limits  URINE MICROSCOPIC-ADD ON - Abnormal; Notable for the following:    Squamous Epithelial / LPF 0-5 (*)    All other components within normal limits  CBG MONITORING, ED - Abnormal; Notable for the following:    Glucose-Capillary 338 (*)    All other components within normal limits  POC URINE PREG, ED    Imaging Review Dg Chest 2 View  05/01/2016  CLINICAL DATA:  Cough and wheezing this morning. EXAM: CHEST  2 VIEW COMPARISON:  04/27/2016 FINDINGS: Borderline cardiomegaly is unchanged. The lungs are clear. The pulmonary vasculature is normal. Hilar and mediastinal contours are unremarkable. There is no pleural effusion. IMPRESSION: Borderline cardiomegaly.  No acute findings. Electronically Signed   By: Ellery Plunk M.D.   On: 05/01/2016 06:52   I have personally reviewed and evaluated these lab results as part of my medical decision-making.   EKG Interpretation None     Medications  albuterol (PROVENTIL,VENTOLIN) solution continuous neb (0 mg/hr Nebulization Hold 05/01/16 0656)  sodium chloride 0.9 % bolus 1,000 mL (not administered)  magnesium sulfate IVPB 2 g 50 mL (not administered)  albuterol (PROVENTIL) (2.5 MG/3ML) 0.083% nebulizer solution 5 mg (5 mg Nebulization Given 05/01/16 0226)   dexamethasone (DECADRON) tablet 10 mg (10 mg Oral Given 05/01/16 0534)  ipratropium-albuterol (DUONEB) 0.5-2.5 (3) MG/3ML nebulizer solution 3 mL (3 mLs Nebulization Given 05/01/16 0540)  ipratropium-albuterol (DUONEB) 0.5-2.5 (3) MG/3ML nebulizer solution 3 mL (3 mLs Nebulization Given 05/01/16 0534)  albuterol (PROVENTIL,VENTOLIN) solution continuous neb (10 mg/hr Nebulization Given 05/01/16 0746)    MDM   Final diagnoses:  Asthma exacerbation  Hyperglycemia   Pt w/ h/o asthma p/w ongoing wheezing and cough; she was evaluated several days ago and given steroids which she states that she discontinued after she started feeling better. On exam, she was dyspneic but in no respiratory distress. Wheezes noted bilaterally. She was tachycardic but normal O2 saturation on room air. Gave 2 breathing treatments and Decadron. Blood glucose was 338, because she has been on steroids recently and endorses noncompliance with metformin, obtained above lab work to rule out DKA.  Labwork shows hyperglycemia without DKA. WBC 13.7 which is not  surprising given recent steroid use. Patient remains dyspneic after several albuterol treatments, therefore will place on continuous albuterol and given magnesium and IV fluid bolus. CXR negative acute. Pt states this feels like previous asthma flares and given wheezing I feel that PE is unlikely. I am signing out to the oncoming provider who will follow up on patient's work of breathing after treatments. If not significantly improved, she may require admission for asthma exacerbation. Pt's disposition pending clinical improvement.  Laurence Spatesachel Morgan Jovanni Eckhart, MD 05/01/16 [phone]

## 2016-05-01 NOTE — ED Notes (Signed)
Pt's sts she plans to leave AMA, wants full meal, paged CCM for diet change orders, encouraged pt to stay pending admission

## 2016-05-01 NOTE — ED Notes (Signed)
Dr notified of lactic acid results

## 2016-05-01 NOTE — Progress Notes (Signed)
Pharmacy Antibiotic Note  Norma SchlatterCandice Rogers is a 35 y.o. female admitted on 05/01/2016 with asthma exacerbation.  Pharmacy has been consulted for levaquin dosing. Pt is afebrile and WBC is elevated at 13.7. Scr is WNL.   Plan: - Levaquin 750mg  IV Q24H *Pharmacy will sign-off as no dose adjustments are anticipated. Thank you for the consult!  Height: 5\' 1"  (154.9 cm) Weight: (!) 318 lb 8 oz (144.471 kg) IBW/kg (Calculated) : 47.8  Temp (24hrs), Avg:97.8 F (36.6 C), Min:97.8 F (36.6 C), Max:97.8 F (36.6 C)   Recent Labs Lab 04/27/16 0919 05/01/16 0523 05/01/16 0946  WBC 11.4* 13.7*  --   CREATININE 0.75 0.89  --   LATICACIDVEN  --   --  6.92*    Estimated Creatinine Clearance: 119.3 mL/min (by C-G formula based on Cr of 0.89).    Allergies  Allergen Reactions  . Lamictal [Lamotrigine] Itching, Rash and Other (See Comments)    LIP SWELLING   . Bupropion Other (See Comments)    BITING OF LIPS OCCURS WITH MEDICINE    Antimicrobials this admission: Levaquin 5/11>>  Thank you for allowing pharmacy to be a part of this patient's care.  Jenson Beedle, Drake LeachRachel Lynn 05/01/2016 10:52 AM

## 2016-05-01 NOTE — ED Provider Notes (Signed)
Care assumed from Dr. Clarene DukeLittle.  Hx asthma and DM with 1 week of wheezing and cough.  She is out of her nebulizers. Denies cough and denies chest pain. Continues nebulizer and process.  Patient reassessed at 9 AM. She is still wheezing and tachypnea. She appears to be somewhat somnolent and has difficulty answering questions. We'll obtain ABG started on BiPAP.  PH 7.27 with PCO2 of 44. Patient is somnolent but arousable. She states she works night shift and normal states at this time. Blood sugar recheck is 270. She denies any chest pain.  She is exchanging air well. She still wheezing. Her mental status is waxing and waning but she is protecting her airway at this time. No indication for BiPAP or intubation. Plan admission for observation.  Lactate 6.9.  IVF given. No source of infection seen. CXR negative. Afebrile.  Patient denies any ingestion. Denies any pain medication use. Patient is on metformin which may be contributing to lactic acidosis.   Patient admits to taking ambien and xanax before arriving to the ED today.  Protecting airway at this time.  D/w critical care.  CRITICAL CARE Performed by: Glynn OctaveANCOUR, Veera Stapleton Total critical care time: 40 minutes Critical care time was exclusive of separately billable procedures and treating other patients. Critical care was necessary to treat or prevent imminent or life-threatening deterioration. Critical care was time spent personally by me on the following activities: development of treatment plan with patient and/or surrogate as well as nursing, discussions with consultants, evaluation of patient's response to treatment, examination of patient, obtaining history from patient or surrogate, ordering and performing treatments and interventions, ordering and review of laboratory studies, ordering and review of radiographic studies, pulse oximetry and re-evaluation of patient's condition.Glynn Octave\   Jaid Quirion, MD 05/01/16 1610

## 2016-05-01 NOTE — H&P (Signed)
PULMONARY / CRITICAL CARE MEDICINE   Name: Garth SchlatterCandice Stinson MRN: [id number] DOB: [birth year]    ADMISSION DATE:  05/01/2016  REFERRING MD:  EDP   CHIEF COMPLAINT:  Dyspnea, lactic acidosis   HISTORY OF PRESENT ILLNESS:   35yo female smoker with hx poorly controlled DM, obesity, asthma presented 5/11 with 1 week hx wheezing, cough and chest congestion.  She was initially improving after steroids, BD's but had progressive lethargy and elevated lactate and PCCM consulted.   Pt is very sleepy but wakes to loud voice and answers questions appropriately.  She denies fevers, chills, purulent sputum, chest pain.    On further questioning she admits to taking Ambien and xanax on her way to the ER this am.    PAST MEDICAL HISTORY :  She  has a past medical history of Obesity; Diabetes mellitus without complication (HCC); Anxiety; and Asthma.  PAST SURGICAL HISTORY: She  has past surgical history that includes Dental surgery.  Allergies  Allergen Reactions  . Lamictal [Lamotrigine] Itching, Rash and Other (See Comments)    LIP SWELLING   . Bupropion Other (See Comments)    BITING OF LIPS OCCURS WITH MEDICINE    No current facility-administered medications on file prior to encounter.   Current Outpatient Prescriptions on File Prior to Encounter  Medication Sig  . ALPRAZolam (XANAX) 1 MG tablet Take 1 mg by mouth 3 (three) times daily.  Marland Kitchen. loxapine (LOXITANE) 25 MG capsule Take 50 mg by mouth at bedtime.  . metFORMIN (GLUCOPHAGE) 500 MG tablet Take 2 tablets (1,000 mg total) by mouth 2 (two) times daily.  Marland Kitchen. zolpidem (AMBIEN) 10 MG tablet Take 10 mg by mouth at bedtime.  Marland Kitchen. albuterol (PROVENTIL HFA;VENTOLIN HFA) 108 (90 Base) MCG/ACT inhaler Inhale 1-2 puffs into the lungs every 4 (four) hours as needed for wheezing or shortness of breath. (Patient not taking: Reported on 05/01/2016)  . predniSONE (DELTASONE) 20 MG tablet Take 3 tablets (60 mg total) by mouth daily. (Patient not taking: Reported  on 05/01/2016)    FAMILY HISTORY:  Her has no family status information on file.   SOCIAL HISTORY: She  reports that she has been smoking.  She does not have any smokeless tobacco history on file. She reports that she drinks alcohol. She reports that she does not use illicit drugs.  REVIEW OF SYSTEMS:   As per HPI - All other systems reviewed and were neg.   SUBJECTIVE:   VITAL SIGNS: BP 112/82 mmHg  Pulse 105  Temp(Src) 97.8 F (36.6 C) (Oral)  Resp 35  Ht 5\' 1"  (1.549 m)  Wt 144.471 kg (318 lb 8 oz)  BMI 60.21 kg/m2  SpO2 100%  LMP 03/24/2016  HEMODYNAMICS:    VENTILATOR SETTINGS:    INTAKE / OUTPUT:    PHYSICAL EXAMINATION: General:  Obese female, ill appearing  Neuro:  Lethargic but easily arousable, follows commands, MAE  HEENT:  Mm dry, no JVD  Cardiovascular:  s1s2 rrr, mild tachy  Lungs:  resps even, mildly labored on [place], diffuse wheeze, no accessory muscle use  Abdomen:  Obese, round, soft, +bs  Musculoskeletal:  Warm and dry, no edema   LABS:  BMET  Recent Labs Lab 04/27/16 0919 05/01/16 0523  NA 138 137  K 3.7 3.6  CL 101 101  CO2 24 22  BUN <5* 8  CREATININE 0.75 0.89  GLUCOSE 213* 316*    Electrolytes  Recent Labs Lab 04/27/16 0919 05/01/16 0523  CALCIUM 10.1 8.8*  CBC  Recent Labs Lab 04/27/16 0919 05/01/16 0523  WBC 11.4* 13.7*  HGB 12.1 10.5*  HCT 36.7 31.3*  PLT 377 366    Coag's No results for input(s): APTT, INR in the last 168 hours.  Sepsis Markers  Recent Labs Lab 05/01/16 0946  LATICACIDVEN 6.92*    ABG  Recent Labs Lab 05/01/16 0922  PHART 7.268*  PCO2ART 44.6  PO2ART 62.0*    Liver Enzymes No results for input(s): AST, ALT, ALKPHOS, BILITOT, ALBUMIN in the last 168 hours.  Cardiac Enzymes No results for input(s): TROPONINI, PROBNP in the last 168 hours.  Glucose  Recent Labs Lab 04/27/16 0748 04/27/16 0901 05/01/16 0509 05/01/16 0928  GLUCAP 207* 198* 338* 286*     Imaging Dg Chest 2 View  05/01/2016  CLINICAL DATA:  Cough and wheezing this morning. EXAM: CHEST  2 VIEW COMPARISON:  04/27/2016 FINDINGS: Borderline cardiomegaly is unchanged. The lungs are clear. The pulmonary vasculature is normal. Hilar and mediastinal contours are unremarkable. There is no pleural effusion. IMPRESSION: Borderline cardiomegaly.  No acute findings. Electronically Signed   By: Ellery Plunk M.D.   On: 05/01/2016 06:52     STUDIES:    CULTURES: BC x 2 5/11>>>  ANTIBIOTICS: levaquin 5/11>>>  SIGNIFICANT EVENTS:   LINES/TUBES:   DISCUSSION: 35yo female with hx asthma, poorly controlled DM presented 5/11 with asthma exacerbation and AMS presumed r/t oversedation from Ambien, xanax.    ASSESSMENT / PLAN:  PULMONARY Acute respiratory failure r/t asthma exacerbation  Suspected OSA  Tobacco abuse  P:   Supplemental O2  PRN bipap  Steroids  BD's  pulm hygiene  Smoking cessation  F/u CXR   CARDIOVASCULAR Tachycardia - mild r/t dyspnea and albuterol  SIRS -- elevated lactate, mildly elevated WBC.  Afebrile, clear CXR P:  Monitor HR  Gentle volume  Trend lactate, pct  Check BNP, troponin   RENAL Elevated lactic acid -- on metformin  P:   Gentle volume  Trend lactate as above  Hold metformin  F/u chem   GASTROINTESTINAL Obesity  P:   PPI  NPO for now   HEMATOLOGIC Anemia - mild  P:  Sub q heparin   INFECTIOUS Asthma exacerbation  Elevated lactate  P:   Pan culture  Empiric levaquin    ENDOCRINE Poorly controlled DM    P:   SSI - resistant scale  hgbA1c  Check TSH   NEUROLOGIC Lethargy - likely multifactorial in setting respiratory failure and xanax, ambien pt took en route to ER.  Not hypercarbic on ABG.   P:   ICU monitoring  Avoid sedating medications  rx asthma  PRN bipap    FAMILY  - Updates: no family available 5/11  - Inter-disciplinary family meet or Palliative Care meeting due by:  day 7    Dirk Dress, NP 05/01/2016  10:46 AM Pager: [phone] or [phone]  Attending Note:  35 year old female asthmatic who works nights who drove to hospital from work for [employer] and took her xanax and Remus Loffler prior to coming to the hospital explaining her mental status.  The patient is clearly wheezing on exam but arousable and interactive.  She also takes metformin and her lactic acid is elevated.  She is not requiring mechanical assistance right now but will admit to the ICU for closer monitoring.  Levaquin and steroids, continue bronchodilators and will f/u in the ICU.  The patient is critically ill with multiple organ systems failure and requires  high complexity decision making for assessment and support, frequent evaluation and titration of therapies, application of advanced monitoring technologies and extensive interpretation of multiple databases.   Critical Care Time devoted to patient care services described in this note is  35  Minutes. This time reflects time of care of this signee Dr Jennet Maduro. This critical care time does not reflect procedure time, or teaching time or supervisory time of PA/NP/Med student/Med Resident etc but could involve care discussion time.  Rush Farmer, M.D. [facility] Pulmonary/Critical Care Medicine. Pager: [phone]. After hours pager: [phone].

## 2016-05-01 NOTE — ED Notes (Signed)
Reported lactic acid of 6.1 to receiving RN Brandy at bedside.

## 2016-05-01 NOTE — Progress Notes (Signed)
Results for Garth SchlatterWEEKS, Marlee (MRN [id number]) as of 05/01/2016 13:56  Ref. Range 05/01/2016 05:09 05/01/2016 09:28 05/01/2016 10:49 05/01/2016 12:56  Glucose-Capillary Latest Ref Range: 65-99 mg/dL 409338 (H) 811286 (H) 914310 (H) 315 (H)  Noted that blood sugars are greater than 180 mg/dl. Recommend starting Lantus 15-20 units daily and continuing Novolog RESISTANT correction scale every 4 hours while NPO and then TID & HS when eating.  Smith MinceKendra Anastasios Melander RN BSN CDE

## 2016-05-02 ENCOUNTER — Inpatient Hospital Stay (HOSPITAL_COMMUNITY): Payer: Medicare Other

## 2016-05-02 DIAGNOSIS — J4531 Mild persistent asthma with (acute) exacerbation: Secondary | ICD-10-CM

## 2016-05-02 DIAGNOSIS — E0865 Diabetes mellitus due to underlying condition with hyperglycemia: Secondary | ICD-10-CM

## 2016-05-02 LAB — GLUCOSE, CAPILLARY
GLUCOSE-CAPILLARY: 160 mg/dL — AB (ref 65–99)
GLUCOSE-CAPILLARY: 201 mg/dL — AB (ref 65–99)
GLUCOSE-CAPILLARY: 245 mg/dL — AB (ref 65–99)
Glucose-Capillary: 251 mg/dL — ABNORMAL HIGH (ref 65–99)
Glucose-Capillary: 210 mg/dL — ABNORMAL HIGH (ref 65–99)
Glucose-Capillary: 202 mg/dL — ABNORMAL HIGH (ref 65–99)
Glucose-Capillary: 202 mg/dL — ABNORMAL HIGH (ref 65–99)
Glucose-Capillary: 177 mg/dL — ABNORMAL HIGH (ref 65–99)
Glucose-Capillary: 147 mg/dL — ABNORMAL HIGH (ref 65–99)
Glucose-Capillary: 168 mg/dL — ABNORMAL HIGH (ref 65–99)
Glucose-Capillary: 212 mg/dL — ABNORMAL HIGH (ref 65–99)
Glucose-Capillary: 216 mg/dL — ABNORMAL HIGH (ref 65–99)
Glucose-Capillary: 192 mg/dL — ABNORMAL HIGH (ref 65–99)
Glucose-Capillary: 140 mg/dL — ABNORMAL HIGH (ref 65–99)
Glucose-Capillary: 207 mg/dL — ABNORMAL HIGH (ref 65–99)
Glucose-Capillary: 303 mg/dL — ABNORMAL HIGH (ref 65–99)
Glucose-Capillary: 211 mg/dL — ABNORMAL HIGH (ref 65–99)
Glucose-Capillary: 225 mg/dL — ABNORMAL HIGH (ref 65–99)
Glucose-Capillary: 263 mg/dL — ABNORMAL HIGH (ref 65–99)
Glucose-Capillary: 244 mg/dL — ABNORMAL HIGH (ref 65–99)

## 2016-05-02 LAB — BASIC METABOLIC PANEL
Anion gap: 13 (ref 5–15)
Anion gap: 14 (ref 5–15)
BUN: 8 mg/dL (ref 6–20)
BUN: 8 mg/dL (ref 6–20)
CHLORIDE: 103 mmol/L (ref 101–111)
CHLORIDE: 103 mmol/L (ref 101–111)
CO2: 21 mmol/L — ABNORMAL LOW (ref 22–32)
CO2: 23 mmol/L (ref 22–32)
CREATININE: 0.88 mg/dL (ref 0.44–1.00)
Calcium: 9 mg/dL (ref 8.9–10.3)
Calcium: 9.2 mg/dL (ref 8.9–10.3)
Creatinine, Ser: 0.74 mg/dL (ref 0.44–1.00)
GFR calc Af Amer: >60 mL/min (ref >60)
GFR calc Af Amer: >60 mL/min (ref >60)
GFR calc non Af Amer: >60 mL/min (ref >60)
GFR calc non Af Amer: >60 mL/min (ref >60)
GLUCOSE: 181 mg/dL — AB (ref 65–99)
GLUCOSE: 284 mg/dL — AB (ref 65–99)
POTASSIUM: 3.8 mmol/L (ref 3.5–5.1)
POTASSIUM: 4.3 mmol/L (ref 3.5–5.1)
SODIUM: 137 mmol/L (ref 135–145)
Sodium: 140 mmol/L (ref 135–145)

## 2016-05-02 LAB — CBC
HEMATOCRIT: 32.6 % — AB (ref 36.0–46.0)
HEMOGLOBIN: 10.8 g/dL — AB (ref 12.0–15.0)
MCH: 25.1 pg — AB (ref 26.0–34.0)
MCHC: 33.1 g/dL (ref 30.0–36.0)
MCV: 75.8 fL — AB (ref 78.0–100.0)
Platelets: 367 10*3/uL (ref 150–400)
RBC: 4.3 MIL/uL (ref 3.87–5.11)
RDW: 15 % (ref 11.5–15.5)
WBC: 15.9 10*3/uL — ABNORMAL HIGH (ref 4.0–10.5)

## 2016-05-02 LAB — HEMOGLOBIN A1C
Hgb A1c MFr Bld: 8.9 % — ABNORMAL HIGH (ref 4.8–5.6)
MEAN PLASMA GLUCOSE: 209 mg/dL

## 2016-05-02 LAB — LACTIC ACID, PLASMA: Lactic Acid, Venous: 4.2 mmol/L (ref 0.5–2.0)

## 2016-05-02 LAB — PROCALCITONIN: Procalcitonin: <0.1 ng/mL

## 2016-05-02 MED ORDER — MOMETASONE FURO-FORMOTEROL FUM 200-5 MCG/ACT IN AERO
2.0000 | INHALATION_SPRAY | Freq: Two times a day (BID) | RESPIRATORY_TRACT | Status: DC
  Administered 2016-05-02 – 2016-05-03 (×2): 2 via RESPIRATORY_TRACT
  Filled 2016-05-02: qty 8.8

## 2016-05-02 MED ORDER — METHYLPREDNISOLONE SODIUM SUCC 40 MG IJ SOLR
40.0000 mg | Freq: Every day | INTRAMUSCULAR | Status: DC
  Administered 2016-05-02 – 2016-05-03 (×2): 40 mg via INTRAVENOUS
  Filled 2016-05-02: qty 1

## 2016-05-02 MED ORDER — ALBUTEROL (5 MG/ML) CONTINUOUS INHALATION SOLN
10.0000 mg/h | INHALATION_SOLUTION | Freq: Once | RESPIRATORY_TRACT | Status: AC
  Administered 2016-05-02: 10 mg/h via RESPIRATORY_TRACT
  Filled 2016-05-02: qty 20

## 2016-05-02 MED ORDER — INSULIN GLARGINE 100 UNIT/ML ~~LOC~~ SOLN
20.0000 [IU] | Freq: Every day | SUBCUTANEOUS | Status: DC
  Filled 2016-05-02: qty 0.2

## 2016-05-02 MED ORDER — DEXTROSE-NACL 5-0.9 % IV SOLN
INTRAVENOUS | Status: DC
  Administered 2016-05-02: 10 mL/h via INTRAVENOUS

## 2016-05-02 MED ORDER — IPRATROPIUM-ALBUTEROL 0.5-2.5 (3) MG/3ML IN SOLN
3.0000 mL | Freq: Four times a day (QID) | RESPIRATORY_TRACT | Status: DC
  Administered 2016-05-03: 3 mL via RESPIRATORY_TRACT
  Filled 2016-05-02 (×3): qty 3

## 2016-05-02 MED ORDER — PNEUMOCOCCAL VAC POLYVALENT 25 MCG/0.5ML IJ INJ
0.5000 mL | INJECTION | INTRAMUSCULAR | Status: AC
  Administered 2016-05-03: 0.5 mL via INTRAMUSCULAR
  Filled 2016-05-02 (×2): qty 0.5

## 2016-05-02 MED ORDER — ALBUTEROL (5 MG/ML) CONTINUOUS INHALATION SOLN
10.0000 mg/h | INHALATION_SOLUTION | RESPIRATORY_TRACT | Status: DC
  Filled 2016-05-02: qty 20

## 2016-05-02 MED ORDER — CETYLPYRIDINIUM CHLORIDE 0.05 % MT LIQD
7.0000 mL | Freq: Two times a day (BID) | OROMUCOSAL | Status: DC
  Administered 2016-05-03: 7 mL via OROMUCOSAL

## 2016-05-02 MED ORDER — METHYLPREDNISOLONE SODIUM SUCC 40 MG IJ SOLR
40.0000 mg | Freq: Two times a day (BID) | INTRAMUSCULAR | Status: DC
  Filled 2016-05-02: qty 1

## 2016-05-02 MED ORDER — INSULIN ASPART 100 UNIT/ML ~~LOC~~ SOLN: 0.0000 [IU] | SUBCUTANEOUS | Status: DC

## 2016-05-02 MED ORDER — INSULIN GLARGINE 100 UNIT/ML ~~LOC~~ SOLN
30.0000 [IU] | Freq: Every day | SUBCUTANEOUS | Status: DC
  Administered 2016-05-02: 30 [IU] via SUBCUTANEOUS
  Filled 2016-05-02 (×3): qty 0.3

## 2016-05-02 MED ORDER — INSULIN ASPART 100 UNIT/ML ~~LOC~~ SOLN
0.0000 [IU] | Freq: Every day | SUBCUTANEOUS | Status: DC
  Administered 2016-05-02: 2 [IU] via SUBCUTANEOUS

## 2016-05-02 MED ORDER — LEVOFLOXACIN 750 MG PO TABS
750.0000 mg | ORAL_TABLET | Freq: Every day | ORAL | Status: DC
  Administered 2016-05-02 – 2016-05-03 (×2): 750 mg via ORAL
  Filled 2016-05-02 (×2): qty 1

## 2016-05-02 MED ORDER — INSULIN ASPART 100 UNIT/ML ~~LOC~~ SOLN
0.0000 [IU] | Freq: Three times a day (TID) | SUBCUTANEOUS | Status: DC
Start: 2016-05-03 — End: 2016-05-03
  Administered 2016-05-03 (×3): 15 [IU] via SUBCUTANEOUS

## 2016-05-02 MED ORDER — IPRATROPIUM-ALBUTEROL 0.5-2.5 (3) MG/3ML IN SOLN
3.0000 mL | RESPIRATORY_TRACT | Status: DC
  Administered 2016-05-02 (×3): 3 mL via RESPIRATORY_TRACT
  Filled 2016-05-02 (×3): qty 3

## 2016-05-02 MED ORDER — ALPRAZOLAM 0.5 MG PO TABS
0.5000 mg | ORAL_TABLET | Freq: Two times a day (BID) | ORAL | Status: DC | PRN
  Administered 2016-05-02: 0.5 mg via ORAL
  Filled 2016-05-02: qty 1

## 2016-05-02 MED ORDER — CHLORHEXIDINE GLUCONATE 0.12 % MT SOLN
15.0000 mL | Freq: Two times a day (BID) | OROMUCOSAL | Status: DC
Start: 2016-05-02 — End: 2016-05-03
  Administered 2016-05-02 – 2016-05-03 (×2): 15 mL via OROMUCOSAL
  Filled 2016-05-02: qty 15

## 2016-05-02 NOTE — Progress Notes (Signed)
Patient refused bipap until after dinner, refused abg sticks as per respiratory therapy.

## 2016-05-02 NOTE — Progress Notes (Addendum)
Inpatient Diabetes Program Recommendations  AACE/ADA: New Consensus Statement on Inpatient Glycemic Control (2015)  Target Ranges:  Prepandial:   less than 140 mg/dL      Peak postprandial:   less than 180 mg/dL (1-2 hours)      Critically ill patients:  140 - 180 mg/dL   Review of Glycemic Control  Diabetes history: DM 2 Outpatient Diabetes medications: Metformin 1,000 mg BID Current orders for Inpatient glycemic control: Insulin gtt per ICU protocol A1c 8.9% on 05/01/2016  IV Solumedrol 40 mg Q 12hrs  Inpatient Diabetes Program Recommendations:  A1c indicates somewhat poor control of glucose at home. Patient is still on steroids. Glucostabilizer program trends have patient requiring between 6-8 units of insulin/hr. Insulin gtt still appropriate for patient even though glucose has reached within inpatient goal.   If patient is eating will need the regular Glucostabilizer protocol not ICU. ICU protocol usually is intended for NPO critically ill patients, it does not account for a patient who is consuming food. On the Glucostabilizer protocol we can cover carbs with the glucostabilizer.  Thanks,  Christena DeemShannon Keymoni Mccaster RN, MSN, [facility] Center[facility] Inpatient Diabetes Coordinator Team Pager [phone] (8a-5p)

## 2016-05-02 NOTE — Progress Notes (Signed)
Report called to charge RN [address]

## 2016-05-02 NOTE — Progress Notes (Signed)
Call placed to Dr. Craige CottaSood concerning patient LOC. Rass -1 with periods of apnea during sleep. Patient easily aroused and able to follow commands. ABG ordered and BIPAP. Respiratory therapy advised.

## 2016-05-02 NOTE — Progress Notes (Addendum)
PULMONARY / CRITICAL CARE MEDICINE   Name: Norma SchlatterCandice Rogers MRN: [id number] DOB: 29-Aug-1981    ADMISSION DATE:  05/01/2016  REFERRING MD:  EDP   CHIEF COMPLAINT:  Dyspnea, lactic acidosis   HISTORY OF PRESENT ILLNESS:   35yo female smoker with hx poorly controlled DM, obesity, lifelong asthma presented 5/11 with 1 week hx wheezing, cough and chest congestion.  She was initially improving after steroids, BD's but had progressive lethargy and elevated lactate and PCCM consulted.   Pt was sleepy -had taken Ambien and xanax on her way to the ER this am.       SUBJECTIVE: breathing better On insulin gtt Afebrile No cough  VITAL SIGNS: BP 141/81 mmHg  Pulse 106  Temp(Src) 98.1 F (36.7 C) (Oral)  Resp 35  Ht 5\' 2"  (1.575 m)  Wt 322 lb 5 oz (146.2 kg)  BMI 58.94 kg/m2  SpO2 94%  LMP 03/24/2016  HEMODYNAMICS:    VENTILATOR SETTINGS:    INTAKE / OUTPUT: I/O last 3 completed shifts: In: 235.4 [I.V.:235.4] Out: 150 [Urine:150]  PHYSICAL EXAMINATION: General:  Obese female, ill appearing  Neuro:  awake, follows commands, MAE  HEENT:  Mm dry, no JVD  Cardiovascular:  s1s2 rrr, mild tachy  Lungs:  resps even, mildly labored on [place],decreased BS BL,  no wheeze, no accessory muscle use  Abdomen:  Obese, round, soft, +bs  Musculoskeletal:  Warm and dry, no edema   LABS:  BMET  Recent Labs Lab 05/01/16 1442 05/01/16 2345 05/02/16 0553  NA 139 137 140  K 4.2 4.3 3.8  CL 106 103 103  CO2 18* 21* 23  BUN 7 8 8   CREATININE 0.87 0.88 0.74  GLUCOSE 295* 284* 181*    Electrolytes  Recent Labs Lab 05/01/16 0941 05/01/16 1442 05/01/16 2345 05/02/16 0553  CALCIUM  --  8.3* 9.0 9.2  MG 2.3  --   --   --   PHOS 3.8  --   --   --     CBC  Recent Labs Lab 04/27/16 0919 05/01/16 0523 05/01/16 2345  WBC 11.4* 13.7* 15.9*  HGB 12.1 10.5* 10.8*  HCT 36.7 31.3* 32.6*  PLT 377 366 367    Coag's No results for input(s): APTT, INR in the last 168 hours.  Sepsis  Markers  Recent Labs Lab 05/01/16 0941  05/01/16 1834 05/01/16 2052 05/01/16 2345  LATICACIDVEN  --   < > 6.1* 4.7* 4.2*  PROCALCITON <0.10  --   --   --  <0.10  < > = values in this interval not displayed.  ABG  Recent Labs Lab 05/01/16 0922 05/01/16 1232  PHART 7.268* 7.301*  PCO2ART 44.6 38.5  PO2ART 62.0* 77.0*    Liver Enzymes No results for input(s): AST, ALT, ALKPHOS, BILITOT, ALBUMIN in the last 168 hours.  Cardiac Enzymes  Recent Labs Lab 05/01/16 0941 05/01/16 1637 05/01/16 2218  TROPONINI 0.04* <0.03 <0.03    Glucose  Recent Labs Lab 05/02/16 0144 05/02/16 0248 05/02/16 0353 05/02/16 0500 05/02/16 0605 05/02/16 0735  GLUCAP 251* 210* 202* 202* 177* 147*    Imaging Dg Chest Port 1 View  05/02/2016  CLINICAL DATA:  Respiratory failure, history of asthma, current smoker, diabetes. EXAM: PORTABLE CHEST 1 VIEW COMPARISON:  Chest x-ray of May 01, 2016 FINDINGS: The lungs are well-expanded. There is no focal infiltrate. There is no pleural effusion. The cardiac silhouette is enlarged. The pulmonary vascularity is mildly engorged. There is no significant interstitial or alveolar edema.  There is no pleural effusion or pneumothorax. IMPRESSION: CHF without alveolar edema. No alveolar pneumonia or other acute pulmonary abnormality. Electronically Signed   By: David  [place] M.D.   On: 05/02/2016 07:13     STUDIES:    CULTURES: BC x 2 5/11>>>  ANTIBIOTICS: levaquin 5/11>>>  SIGNIFICANT EVENTS:   LINES/TUBES:   DISCUSSION: 35yo female with hx asthma, poorly controlled DM presented 5/11 with asthma exacerbation and AMS presumed r/t oversedation from Ambien, xanax.    ASSESSMENT / PLAN:  PULMONARY Acute respiratory failure r/t asthma exacerbation  Suspected OSA  Tobacco abuse  P:   Supplemental O2  decrease solumedrol to 40 q 12 - PO in 24 h BD's  pulm hygiene  Smoking cessation  Sleep study as outpt Needs steroid/LABA addition as  outpt  CARDIOVASCULAR Tachycardia - mild r/t dyspnea and albuterol  SIRS -- elevated lactate, mildly elevated WBC.  Afebrile, clear CXR P:  Lactate cleared Low BNP, nml troponin   RENAL Elevated lactic acid -- on metformin  P:   resolved   INFECTIOUS Asthma exacerbation  Elevated lactate  P:   Pan culture  Empiric levaquin    ENDOCRINE Poorly controlled DM     TSH nml P:   Transition off insulin gtt SSI - resistant scale  hgbA1c 8.9   NEUROLOGIC Lethargy - likely multifactorial in setting respiratory failure and xanax, ambien pt took en route to ER.  Not hypercarbic on ABG.   P:    Avoid sedating medications   Asthma exacerbation-re solving, transfer to floor once off insulin gtt   cctime x 51m   Cyril Mourning MD. FCCP. [place] [facility] Pager [phone] If no response call [phone]     05/02/2016  8:40 AM

## 2016-05-02 NOTE — Progress Notes (Signed)
Pt transferred to 3S01 without incident.  Bedside report to Belinda FisherKelly Campos-Garcia, RN.

## 2016-05-02 NOTE — Progress Notes (Signed)
CRITICAL VALUE ALERT  Critical value received:  LA 4.2  Date of notification:  05/02/2016  Time of notification:  0000  Critical value read back:Yes.    Nurse who received alert:  Vilinda BlanksBrandy Aidian Salomon  MD notified (1st page):    Time of first page:    MD notified (2nd page):  Time of second page:  Responding MD:  Dr Darrick Pennaeterding  Time MD responded:  [phone]

## 2016-05-02 NOTE — Progress Notes (Signed)
Glucostabilizer held for 30 mins as per protocol order set. Restarted at original multiplier after 30 min hold. Rate above 30

## 2016-05-03 ENCOUNTER — Inpatient Hospital Stay (HOSPITAL_COMMUNITY): Payer: Medicare Other

## 2016-05-03 DIAGNOSIS — J9601 Acute respiratory failure with hypoxia: Secondary | ICD-10-CM

## 2016-05-03 DIAGNOSIS — J45901 Unspecified asthma with (acute) exacerbation: Secondary | ICD-10-CM | POA: Diagnosis not present

## 2016-05-03 LAB — CBC
HCT: 36.2 % (ref 36.0–46.0)
HEMOGLOBIN: 12 g/dL (ref 12.0–15.0)
MCH: 25.5 pg — AB (ref 26.0–34.0)
MCHC: 33.1 g/dL (ref 30.0–36.0)
MCV: 76.9 fL — AB (ref 78.0–100.0)
Platelets: 417 10*3/uL — ABNORMAL HIGH (ref 150–400)
RBC: 4.71 MIL/uL (ref 3.87–5.11)
RDW: 15 % (ref 11.5–15.5)
WBC: 19.1 10*3/uL — ABNORMAL HIGH (ref 4.0–10.5)

## 2016-05-03 LAB — BASIC METABOLIC PANEL
ANION GAP: 16 — AB (ref 5–15)
BUN: 14 mg/dL (ref 6–20)
CALCIUM: 9.2 mg/dL (ref 8.9–10.3)
CHLORIDE: 98 mmol/L — AB (ref 101–111)
CO2: 21 mmol/L — AB (ref 22–32)
CREATININE: 0.91 mg/dL (ref 0.44–1.00)
GFR calc Af Amer: >60 mL/min (ref >60)
GFR calc non Af Amer: >60 mL/min (ref >60)
GLUCOSE: 331 mg/dL — AB (ref 65–99)
Potassium: 4.4 mmol/L (ref 3.5–5.1)
Sodium: 135 mmol/L (ref 135–145)

## 2016-05-03 LAB — GLUCOSE, CAPILLARY
GLUCOSE-CAPILLARY: 322 mg/dL — AB (ref 65–99)
Glucose-Capillary: 312 mg/dL — ABNORMAL HIGH (ref 65–99)
Glucose-Capillary: 307 mg/dL — ABNORMAL HIGH (ref 65–99)

## 2016-05-03 MED ORDER — SODIUM CHLORIDE 0.9 % IV SOLN: 250.0000 mL | INTRAVENOUS | Status: AC | PRN

## 2016-05-03 MED ORDER — PREDNISONE 20 MG PO TABS: 40.0000 mg | ORAL_TABLET | Freq: Every day | ORAL | Status: DC

## 2016-05-03 MED ORDER — INSULIN GLARGINE 100 UNIT/ML ~~LOC~~ SOLN
6.0000 [IU] | Freq: Once | SUBCUTANEOUS | Status: AC
  Administered 2016-05-03: 6 [IU] via SUBCUTANEOUS
  Filled 2016-05-03: qty 0.06

## 2016-05-03 MED ORDER — IOPAMIDOL (ISOVUE-370) INJECTION 76%
INTRAVENOUS | Status: AC
  Administered 2016-05-03: 100 mL
  Filled 2016-05-03: qty 100

## 2016-05-03 NOTE — Progress Notes (Signed)
RN notified patient requesting to leave AMA.  Upon entering the pts room, I, the RN, provided the patient with education and discussed leaving AMA.  MD notified. Leaving against medical advice paperwork signed and in chart.

## 2016-05-03 NOTE — Progress Notes (Signed)
[facility] PROGRESS NOTE  Garth Schlatter ZOX:[id number] DOB: [birth year] DOA: 05/01/2016   PCP: No PCP Per Patient     Assessment/Plan: Active Problems:   Acute respiratory failure (HCC)   Asthma exacerbation  35yo female smoker with hx poorly controlled DM, obesity, lifelong asthma presented 5/11 with 1 week hx wheezing, cough and chest congestion. She was initially improving after steroids, BD's but had progressive lethargy and elevated lactate and PCCM consulted.   Pt was sleepy -had taken Ambien and xanax on her way to the ER this am.   Assessment and plan Acute respiratory failure r/t asthma exacerbation  Suspected OSA  Tobacco abuse  Still wheezing, currently 95% on room air Change IV Solu-Medrol to oral prednisone BD's  pulm hygiene  Smoking cessation  Sleep study as outpt Continue Dulera   Tachycardia - mild r/t dyspnea and albuterol  SIRS -- elevated lactate, mildly elevated WBC. Afebrile, clear CXR Lactate cleared Low BNP, nml troponin  Will check CT angiogram to rule out pulmonary embolism  Elevated lactic acid -- on metformin  resolved    Asthma exacerbation  Elevated lactate  Pan culture  Empiric levaquin     Poorly controlled DM  TSH nml Transitioned off insulin gtt SSI - resistant scale , will give extra dose of Lantus this morning as his CBGs elevated hgbA1c 8.9   NEUROLOGIC Lethargy - likely multifactorial in setting respiratory failure and xanax, ambien pt took en route to ER. Not hypercarbic on ABG.  P:   Avoid sedating medications        DVT prophylaxsis heparin  Code Status:  Full code    Family Communication: Discussed in detail with the patient, all imaging results, lab results explained to the patient   Disposition Plan:  As above      Consultants:  Critical care    Procedures:  None  Antibiotics: Levofloxacin 5/11       HPI/Subjective: Still tachycardic and  wheezing  Objective: Filed Vitals:   05/03/16 0246 05/03/16 0344 05/03/16 0724 05/03/16 0813  BP:  133/79 127/81   Pulse: 109 106 108   Temp:  97.4 F (36.3 C)    TempSrc:  Oral    Resp: 22 20 22    Height:  5\' 1"  (1.549 m)    Weight:  142.8 kg (314 lb 13.1 oz)    SpO2: 96% 96% 92% 95%    Intake/Output Summary (Last 24 hours) at 05/03/16 8119 Last data filed at 05/03/16 0349  Gross per 24 hour  Intake 1345.27 ml  Output    400 ml  Net 945.27 ml    Exam:  Examination:  General: Obese female, ill appearing  Neuro: awake, follows commands, MAE  HEENT: Mm dry, no JVD  Cardiovascular: s1s2 rrr, mild tachy  Lungs:Wheezing, resps even, mildly labored on [place],decreased BS BL,  , no accessory muscle use  Abdomen: Obese, round, soft, +bs  Musculoskeletal: Warm and dry, no edema     Data Reviewed: I have personally reviewed following labs and imaging studies  Micro Results Recent Results (from the past 240 hour(s))  Culture, blood (Routine X 2) w Reflex to ID Panel     Status: None (Preliminary result)   Collection Time: 05/01/16  9:41 AM  Result Value Ref Range Status   Specimen Description BLOOD RIGHT HAND  Final   Special Requests BOTTLES DRAWN AEROBIC AND ANAEROBIC 5CC  Final   Culture NO GROWTH 1 DAY  Final  Report Status PENDING  Incomplete  Culture, blood (Routine X 2) w Reflex to ID Panel     Status: None (Preliminary result)   Collection Time: 05/01/16 10:00 AM  Result Value Ref Range Status   Specimen Description BLOOD RIGHT ANTECUBITAL  Final   Special Requests BOTTLES DRAWN AEROBIC AND ANAEROBIC 10CC  Final   Culture NO GROWTH 1 DAY  Final   Report Status PENDING  Incomplete  MRSA PCR Screening     Status: None   Collection Time: 05/01/16  8:52 PM  Result Value Ref Range Status   MRSA by PCR NEGATIVE NEGATIVE Final    Comment:        The GeneXpert MRSA Assay (FDA approved for NASAL specimens only), is one component of a comprehensive MRSA  colonization surveillance program. It is not intended to diagnose MRSA infection nor to guide or monitor treatment for MRSA infections.     Radiology Reports Dg Chest 2 View  05/01/2016  CLINICAL DATA:  Cough and wheezing this morning. EXAM: CHEST  2 VIEW COMPARISON:  04/27/2016 FINDINGS: Borderline cardiomegaly is unchanged. The lungs are clear. The pulmonary vasculature is normal. Hilar and mediastinal contours are unremarkable. There is no pleural effusion. IMPRESSION: Borderline cardiomegaly.  No acute findings. Electronically Signed   By: Ellery Plunkaniel R Mitchell M.D.   On: 05/01/2016 06:52   Dg Chest 2 View  04/27/2016  CLINICAL DATA:  Hyperglycemia.  History of asthma EXAM: CHEST  2 VIEW COMPARISON:  October 17, 2015 FINDINGS: No edema or consolidation. Heart size and pulmonary vascularity are within normal limits. No adenopathy. No bone lesions. IMPRESSION: No edema or consolidation. Electronically Signed   By: Bretta BangWilliam  Woodruff III M.D.   On: 04/27/2016 09:48   Dg Chest Port 1 View  05/02/2016  CLINICAL DATA:  Respiratory failure, history of asthma, current smoker, diabetes. EXAM: PORTABLE CHEST 1 VIEW COMPARISON:  Chest x-ray of May 01, 2016 FINDINGS: The lungs are well-expanded. There is no focal infiltrate. There is no pleural effusion. The cardiac silhouette is enlarged. The pulmonary vascularity is mildly engorged. There is no significant interstitial or alveolar edema. There is no pleural effusion or pneumothorax. IMPRESSION: CHF without alveolar edema. No alveolar pneumonia or other acute pulmonary abnormality. Electronically Signed   By: David  SwazilandJordan M.D.   On: 05/02/2016 07:13     CBC  Recent Labs Lab 04/27/16 0919 05/01/16 0523 05/01/16 2345 05/03/16 0513  WBC 11.4* 13.7* 15.9* 19.1*  HGB 12.1 10.5* 10.8* 12.0  HCT 36.7 31.3* 32.6* 36.2  PLT 377 366 367 417*  MCV 76.6* 77.7* 75.8* 76.9*  MCH 25.3* 26.1 25.1* 25.5*  MCHC 33.0 33.5 33.1 33.1  RDW 14.4 14.9 15.0 15.0   LYMPHSABS 3.7 1.9  --   --   MONOABS 0.6 0.9  --   --   EOSABS 0.4 0.0  --   --   BASOSABS 0.0 0.0  --   --     Chemistries   Recent Labs Lab 05/01/16 0523 05/01/16 0941 05/01/16 1442 05/01/16 2345 05/02/16 0553 05/03/16 0513  NA 137  --  139 137 140 135  K 3.6  --  4.2 4.3 3.8 4.4  CL 101  --  106 103 103 98*  CO2 22  --  18* 21* 23 21*  GLUCOSE 316*  --  295* 284* 181* 331*  BUN 8  --  7 8 8 14   CREATININE 0.89 0.92 0.87 0.88 0.74 0.91  CALCIUM 8.8*  --  8.3*  9.0 9.2 9.2  MG  --  2.3  --   --   --   --    ------------------------------------------------------------------------------------------------------------------ estimated creatinine clearance is 115.8 mL/min (by C-G formula based on Cr of 0.91). ------------------------------------------------------------------------------------------------------------------  Recent Labs  05/01/16 0941  HGBA1C 8.9*   ------------------------------------------------------------------------------------------------------------------ No results for input(s): CHOL, HDL, LDLCALC, TRIG, CHOLHDL, LDLDIRECT in the last 72 hours. ------------------------------------------------------------------------------------------------------------------  Recent Labs  05/01/16 1135  TSH 0.418   ------------------------------------------------------------------------------------------------------------------ No results for input(s): VITAMINB12, FOLATE, FERRITIN, TIBC, IRON, RETICCTPCT in the last 72 hours.  Coagulation profile No results for input(s): INR, PROTIME in the last 168 hours.  No results for input(s): DDIMER in the last 72 hours.  Cardiac Enzymes  Recent Labs Lab 05/01/16 0941 05/01/16 1637 05/01/16 2218  TROPONINI 0.04* <0.03 <0.03   ------------------------------------------------------------------------------------------------------------------ Invalid input(s): POCBNP   CBG:  Recent Labs Lab 05/02/16 1723  05/02/16 1827 05/02/16 1931 05/02/16 2230 05/03/16 0738  GLUCAP 211* 225* 263* 244* 322*       Studies: Dg Chest Port 1 View  05/02/2016  CLINICAL DATA:  Respiratory failure, history of asthma, current smoker, diabetes. EXAM: PORTABLE CHEST 1 VIEW COMPARISON:  Chest x-ray of May 01, 2016 FINDINGS: The lungs are well-expanded. There is no focal infiltrate. There is no pleural effusion. The cardiac silhouette is enlarged. The pulmonary vascularity is mildly engorged. There is no significant interstitial or alveolar edema. There is no pleural effusion or pneumothorax. IMPRESSION: CHF without alveolar edema. No alveolar pneumonia or other acute pulmonary abnormality. Electronically Signed   By: David  [place] M.D.   On: 05/02/2016 07:13      Lab Results  Component Value Date   HGBA1C 8.9* 05/01/2016   Lab Results  Component Value Date   CREATININE 0.91 05/03/2016       Scheduled Meds: . antiseptic oral rinse  7 mL Mouth Rinse q12n4p  . chlorhexidine  15 mL Mouth Rinse BID  . heparin  5,000 Units Subcutaneous Q8H  . insulin aspart  0-20 Units Subcutaneous TID WC  . insulin aspart  0-5 Units Subcutaneous QHS  . insulin glargine  30 Units Subcutaneous QHS  . insulin glargine  6 Units Subcutaneous Once  . ipratropium-albuterol  3 mL Nebulization Q6H WA  . levofloxacin  750 mg Oral Daily  . methylPREDNISolone (SOLU-MEDROL) injection  40 mg Intravenous Daily  . mometasone-formoterol  2 puff Inhalation BID  . pneumococcal 23 valent vaccine  0.5 mL Intramuscular Tomorrow-1000   Continuous Infusions:    LOS: 2 days    Time spent: >30 MINS    [facility]  [facility] Pager [phone]. If 7PM-7AM, please contact night-coverage at [email or web address], password [facility] 05/03/2016, 8:28 AM  LOS: 2 days

## 2016-05-03 NOTE — Procedures (Signed)
Placed patient on CPAP for the night on auto mode 5-20cm H20. Patient is sleeping and tolerating well.

## 2016-05-03 NOTE — Progress Notes (Addendum)
Pt ambulated 900 feet.  Oxygen saturation maintained 95% and higher during ambulation on room air.

## 2016-05-04 NOTE — Discharge Summary (Signed)
RN notified me at 6:58 PM that the patient is leaving AGAINST MEDICAL ADVICE  [facility]  Patient was advised not to leave the hospital due to ongoing tachycardia, hypoxia, leukocytosis, however the patient still decided to leave -reason given was "I need to go back to work"  Patient did not receive any prescriptions at discharge paperwork,

## 2016-05-06 LAB — CULTURE, BLOOD (ROUTINE X 2)
Culture: NO GROWTH
Culture: NO GROWTH

## 2016-06-03 ENCOUNTER — Encounter (HOSPITAL_COMMUNITY): Payer: Self-pay | Admitting: *Deleted

## 2016-06-03 ENCOUNTER — Emergency Department (HOSPITAL_COMMUNITY)
Admission: EM | Admit: 2016-06-03 | Discharge: 2016-06-03 | Disposition: A | Discharge status: Home / Self Care | Payer: Medicare Other | Attending: Emergency Medicine | Admitting: Emergency Medicine

## 2016-06-03 ENCOUNTER — Emergency Department (HOSPITAL_COMMUNITY): Payer: Medicare Other

## 2016-06-03 DIAGNOSIS — E119 Type 2 diabetes mellitus without complications: Secondary | ICD-10-CM | POA: Insufficient documentation

## 2016-06-03 DIAGNOSIS — Z7984 Long term (current) use of oral hypoglycemic drugs: Secondary | ICD-10-CM | POA: Insufficient documentation

## 2016-06-03 DIAGNOSIS — J45901 Unspecified asthma with (acute) exacerbation: Secondary | ICD-10-CM | POA: Insufficient documentation

## 2016-06-03 DIAGNOSIS — R0682 Tachypnea, not elsewhere classified: Secondary | ICD-10-CM | POA: Insufficient documentation

## 2016-06-03 DIAGNOSIS — F17200 Nicotine dependence, unspecified, uncomplicated: Secondary | ICD-10-CM | POA: Diagnosis not present

## 2016-06-03 DIAGNOSIS — J45909 Unspecified asthma, uncomplicated: Secondary | ICD-10-CM | POA: Diagnosis present

## 2016-06-03 DIAGNOSIS — Z79899 Other long term (current) drug therapy: Secondary | ICD-10-CM | POA: Insufficient documentation

## 2016-06-03 MED ORDER — ALBUTEROL SULFATE (2.5 MG/3ML) 0.083% IN NEBU
5.0000 mg | INHALATION_SOLUTION | Freq: Once | RESPIRATORY_TRACT | Status: AC
  Administered 2016-06-03: 5 mg via RESPIRATORY_TRACT
  Filled 2016-06-03: qty 6

## 2016-06-03 MED ORDER — PREDNISONE 20 MG PO TABS
60.0000 mg | ORAL_TABLET | Freq: Once | ORAL | Status: AC
  Administered 2016-06-03: 60 mg via ORAL
  Filled 2016-06-03: qty 3

## 2016-06-03 MED ORDER — IPRATROPIUM BROMIDE 0.02 % IN SOLN
0.5000 mg | Freq: Once | RESPIRATORY_TRACT | Status: AC
  Administered 2016-06-03: 0.5 mg via RESPIRATORY_TRACT
  Filled 2016-06-03: qty 2.5

## 2016-06-03 MED ORDER — PREDNISONE 20 MG PO TABS: 60.0000 mg | ORAL_TABLET | Freq: Every day | ORAL | Status: DC

## 2016-06-03 MED ORDER — IPRATROPIUM-ALBUTEROL 0.5-2.5 (3) MG/3ML IN SOLN
3.0000 mL | Freq: Once | RESPIRATORY_TRACT | Status: AC
  Administered 2016-06-03: 3 mL via RESPIRATORY_TRACT

## 2016-06-03 MED ORDER — ALBUTEROL SULFATE HFA 108 (90 BASE) MCG/ACT IN AERS: 1.0000 | INHALATION_SPRAY | Freq: Four times a day (QID) | RESPIRATORY_TRACT | Status: AC | PRN

## 2016-06-03 MED ORDER — IPRATROPIUM-ALBUTEROL 0.5-2.5 (3) MG/3ML IN SOLN
RESPIRATORY_TRACT | Status: AC
  Administered 2016-06-03: 3 mL via RESPIRATORY_TRACT
  Filled 2016-06-03: qty 3

## 2016-06-03 NOTE — ED Notes (Signed)
Patient refused chest xray.

## 2016-06-03 NOTE — ED Notes (Signed)
Pt refusing wheelchair to be transported to patient room. Pt RR labored. Very short of breath. Upon getting to patient room patient requesting to walk to restroom, discussed with patient that this RN would like to check VS before allowing to exert self more due to possibility of hypoxia. Pt reports "yall trying to keep me, I got to go to work, this is just my asthma." pt pausing during sentence to catch breath.

## 2016-06-03 NOTE — ED Notes (Signed)
Pt wheeled to waiting room. Pt remains tachypneic and appears short of breath but reports relief of symptoms and is ready to be discharged. RR remains >25. PA aware.

## 2016-06-03 NOTE — ED Notes (Signed)
Patient states she has been SOB since yesterday

## 2016-06-03 NOTE — ED Notes (Signed)
Pt reports chest tightness x2-3 days ago with worsening shortness of breath. Reports utilizing inhaler at home without any relief.

## 2016-06-03 NOTE — ED Provider Notes (Signed)
CSN: [id number]     Arrival date & time 06/03/16  6578 History   First MD Initiated Contact with Patient 06/03/16 [phone]     Chief Complaint  Patient presents with  . Shortness of Breath  . Asthma     (Consider location/radiation/quality/duration/timing/severity/associated sxs/prior Treatment) HPI Comments: Patient with a history of Asthma presents today with SOB, wheezing, and chest tightness.  She reports onset of symptoms a couple of days ago and states that her symptoms have been worsening since that time.  She reports that her symptoms feels like an asthma exacerbation.  She had been using her Albuterol inhaler, but states that she recently ran out.  She denies chest pain, cough, hemoptysis, LE edema, fever, or chills.  She denies history of PE or DVT.  Denies prolonged travel or surgeries in the past 4 [place].  Denies exogenous estrogen use.    Patient is a 35 y.o. female presenting with shortness of breath and asthma. The history is provided by the patient.  Shortness of Breath Asthma    Past Medical History  Diagnosis Date  . Obesity   . Diabetes mellitus without complication (HCC)   . Anxiety   . Asthma    Past Surgical History  Procedure Laterality Date  . Dental surgery     No family history on file. Social History  Substance Use Topics  . Smoking status: Current Every Day Smoker  . Smokeless tobacco: Never Used  . Alcohol Use: Yes   OB History    No data available     Review of Systems  Respiratory: Positive for shortness of breath.   All other systems reviewed and are negative.     Allergies  Lamictal and Bupropion  Home Medications   Prior to Admission medications   Medication Sig Start Date End Date Taking? Authorizing Provider  albuterol (PROVENTIL HFA;VENTOLIN HFA) 108 (90 Base) MCG/ACT inhaler Inhale 1-2 puffs into the lungs every 4 (four) hours as needed for wheezing or shortness of breath. Patient not taking: Reported on 05/01/2016 04/27/16    Pricilla Loveless, MD  ALPRAZolam Prudy Feeler) 1 MG tablet Take 1 mg by mouth 3 (three) times daily. 03/27/16   Historical Provider, MD  loxapine (LOXITANE) 25 MG capsule Take 50 mg by mouth at bedtime. 09/14/15   Historical Provider, MD  metFORMIN (GLUCOPHAGE) 500 MG tablet Take 2 tablets (1,000 mg total) by mouth 2 (two) times daily. 10/17/15   Cathren Laine, MD  zolpidem (AMBIEN) 10 MG tablet Take 10 mg by mouth at bedtime. 10/13/15   Historical Provider, MD   BP 146/95 mmHg  Pulse 116  Temp(Src) 98.9 F (37.2 C) (Oral)  Resp 24  Wt 142.429 kg  SpO2 98%  LMP 04/25/2016 Physical Exam  Constitutional: She appears well-developed and well-nourished.  HENT:  Head: Normocephalic and atraumatic.  Mouth/Throat: Oropharynx is clear and moist.  Neck: Normal range of motion. Neck supple.  Cardiovascular: Normal rate, regular rhythm and normal heart sounds.   Pulmonary/Chest: Effort normal. Tachypnea noted. No respiratory distress. She has decreased breath sounds. She has wheezes.  Mild expiratory wheezing at the bases of both lungs Patient speaking in complete sentences  Musculoskeletal: Normal range of motion.  Neurological: She is alert.  Skin: Skin is warm and dry.  Psychiatric: She has a normal mood and affect.  Nursing note and vitals reviewed.   ED Course  Procedures (including critical care time) Labs Review Labs Reviewed - No data to display  Imaging Review No results found.  I have personally reviewed and evaluated these images and lab results as part of my medical decision-making.   EKG Interpretation None     9:13 AM Reassessed patient.  She reports that she is "feeling great" at this time.  Lungs CTAB.  Denies SOB or chest pain. MDM   Final diagnoses:  None   Patient with a history of Asthma presents today with SOB, chest tightness, and wheezing.  She states that her symptoms are similar to symptoms that she has had in the past with an Asthma Exacerbation.  Symptoms improved  in the ED after given breathing treatment and Prednisone.  Patient is tachycardic.  Review of the chart shows that she has had tachycardia with previous ED visits as well.  She denies any CP.  She denies any SOB after given breathing treatments.  Denies hemoptysis.  Denies exogenous estrogen use.  No LE edema.  No prolonged travel or surgeries in the past 4 [place].  Therefore, feel that PE is unlikely.  Patient requesting to be discharge.  Patient stable for discharge.  Return precautions given.      Santiago GladHeather Tommey Barret, PA-C 06/03/16 1557  Lavera Guiseana Duo Liu, MD 06/03/16 [phone]

## 2017-01-23 ENCOUNTER — Emergency Department (HOSPITAL_BASED_OUTPATIENT_CLINIC_OR_DEPARTMENT_OTHER)
Admission: EM | Admit: 2017-01-23 | Discharge: 2017-01-23 | Disposition: A | Discharge status: Home / Self Care | Payer: Medicare Other | Attending: Emergency Medicine | Admitting: Emergency Medicine

## 2017-01-23 ENCOUNTER — Emergency Department (HOSPITAL_BASED_OUTPATIENT_CLINIC_OR_DEPARTMENT_OTHER): Payer: Medicare Other

## 2017-01-23 ENCOUNTER — Encounter (HOSPITAL_BASED_OUTPATIENT_CLINIC_OR_DEPARTMENT_OTHER): Payer: Self-pay | Admitting: Emergency Medicine

## 2017-01-23 DIAGNOSIS — R05 Cough: Secondary | ICD-10-CM

## 2017-01-23 DIAGNOSIS — R059 Cough, unspecified: Secondary | ICD-10-CM

## 2017-01-23 DIAGNOSIS — Z7984 Long term (current) use of oral hypoglycemic drugs: Secondary | ICD-10-CM | POA: Diagnosis not present

## 2017-01-23 DIAGNOSIS — Z87891 Personal history of nicotine dependence: Secondary | ICD-10-CM | POA: Insufficient documentation

## 2017-01-23 DIAGNOSIS — E119 Type 2 diabetes mellitus without complications: Secondary | ICD-10-CM | POA: Insufficient documentation

## 2017-01-23 DIAGNOSIS — J4521 Mild intermittent asthma with (acute) exacerbation: Secondary | ICD-10-CM

## 2017-01-23 MED ORDER — IPRATROPIUM BROMIDE 0.02 % IN SOLN
0.5000 mg | Freq: Once | RESPIRATORY_TRACT | Status: AC
Start: 2017-01-23 — End: 2017-01-23
  Administered 2017-01-23: 0.5 mg via RESPIRATORY_TRACT
  Filled 2017-01-23: qty 2.5

## 2017-01-23 MED ORDER — IPRATROPIUM BROMIDE 0.02 % IN SOLN
RESPIRATORY_TRACT | Status: AC
  Filled 2017-01-23: qty 2.5

## 2017-01-23 MED ORDER — ALBUTEROL SULFATE (2.5 MG/3ML) 0.083% IN NEBU
5.0000 mg | INHALATION_SOLUTION | Freq: Once | RESPIRATORY_TRACT | Status: AC
  Administered 2017-01-23: 5 mg via RESPIRATORY_TRACT
  Filled 2017-01-23: qty 6

## 2017-01-23 MED ORDER — ALBUTEROL SULFATE HFA 108 (90 BASE) MCG/ACT IN AERS
2.0000 | INHALATION_SPRAY | RESPIRATORY_TRACT | Status: DC | PRN
  Administered 2017-01-23: 2 via RESPIRATORY_TRACT
  Filled 2017-01-23: qty 6.7

## 2017-01-23 MED ORDER — ALBUTEROL SULFATE (2.5 MG/3ML) 0.083% IN NEBU
INHALATION_SOLUTION | RESPIRATORY_TRACT | Status: AC
  Filled 2017-01-23: qty 6

## 2017-01-23 MED ORDER — PREDNISONE 50 MG PO TABS
60.0000 mg | ORAL_TABLET | Freq: Once | ORAL | Status: AC
  Administered 2017-01-23: 60 mg via ORAL
  Filled 2017-01-23: qty 1

## 2017-01-23 MED ORDER — PREDNISONE 20 MG PO TABS: ORAL_TABLET | ORAL | 0 refills | Status: AC

## 2017-01-23 MED FILL — predniSONE 20 MG TABS: 20 | 9 days supply | Qty: 12 | Fill #0

## 2017-01-23 NOTE — ED Provider Notes (Signed)
[facility]   CSN: [id number] Arrival date & time: 01/23/17  1121     History   Chief Complaint Chief Complaint  Patient presents with  . Cough    HPI Norma Rogers is a 36 y.o. female.  The history is provided by the patient and medical records.    36 year old female with history of anxiety, asthma, diabetes, sleep apnea, presenting to the ED for cough and wheezing. She reports this is been ongoing for the past 2 days. Cough is productive with clear sputum. She denies any fever or chills. No chest pain. States typically when she gets sick she tends to have an asthma flare. States [employer] she works and has been smoking recently as well which is a known trigger for her asthma. States she is tried using her home inhaler without relief and has now run out. States generally she has flares like this to 3 times year. States she feels like she needs a breathing treatment.  Past Medical History:  Diagnosis Date  . Anxiety   . Asthma   . Diabetes mellitus without complication (HCC)   . Obesity     Patient Active Problem List   Diagnosis Date Noted  . Asthma exacerbation   . Acute respiratory failure (HCC) 05/01/2016  . OBSTRUCTIVE SLEEP APNEA 04/05/2010    Past Surgical History:  Procedure Laterality Date  . DENTAL SURGERY      OB History    No data available       Home Medications    Prior to Admission medications   Medication Sig Start Date End Date Taking? Authorizing Provider  albuterol (PROVENTIL HFA;VENTOLIN HFA) 108 (90 Base) MCG/ACT inhaler Inhale 1-2 puffs into the lungs every 6 (six) hours as needed for wheezing or shortness of breath. 06/03/16   Heather Laisure, PA-C  ALPRAZolam Prudy Feeler(XANAX) 1 MG tablet Take 1 mg by mouth 3 (three) times daily. 03/27/16   Historical Provider, MD  loxapine (LOXITANE) 25 MG capsule Take 50 mg by mouth at bedtime. 09/14/15   Historical Provider, MD  metFORMIN (GLUCOPHAGE) 500 MG tablet Take 2 tablets  (1,000 mg total) by mouth 2 (two) times daily. 10/17/15   Cathren LaineKevin Steinl, MD  zolpidem (AMBIEN) 10 MG tablet Take 10 mg by mouth at bedtime. 10/13/15   Historical Provider, MD    Family History No family history on file.  Social History Social History  Substance Use Topics  . Smoking status: Former Games developermoker  . Smokeless tobacco: Never Used  . Alcohol use Yes     Allergies   Lamictal [lamotrigine] and Bupropion   Review of Systems Review of Systems  Respiratory: Positive for cough and wheezing.   All other systems reviewed and are negative.    Physical Exam Updated Vital Signs BP 136/91 (BP Location: Right Arm)   Pulse 104   Temp 98.2 F (36.8 C) (Oral)   Resp (!) 28   Ht 5\' 1"  (1.549 m)   Wt (!) 142.9 kg   LMP 12/22/2016   SpO2 95%   BMI 59.52 kg/m   Physical Exam  Constitutional: She is oriented to person, place, and time. She appears well-developed and well-nourished.  Morbidly obese  HENT:  Head: Normocephalic and atraumatic.  Mouth/Throat: Oropharynx is clear and moist.  Eyes: Conjunctivae and EOM are normal. Pupils are equal, round, and reactive to light.  Neck: Normal range of motion.  No JVD  Cardiovascular: Normal rate, regular rhythm and normal heart sounds.   Pulmonary/Chest:  Effort normal. No respiratory distress. She has wheezes.  Faint expiratory wheezes, no distress, speaking in sentences without difficulty  Abdominal: Soft. Bowel sounds are normal.  Musculoskeletal: Normal range of motion.  Trace edema at the ankles  Neurological: She is alert and oriented to person, place, and time.  Skin: Skin is warm and dry.  Psychiatric: She has a normal mood and affect.  Nursing note and vitals reviewed.    ED Treatments / Results  Labs (all labs ordered are listed, but only abnormal results are displayed) Labs Reviewed - No data to display  EKG  EKG Interpretation None       Radiology Dg Chest 2 View  Result Date: 01/23/2017 CLINICAL DATA:   Cough, shortness of breath for 2 days EXAM: CHEST  2 VIEW COMPARISON:  11/30/2016 FINDINGS: Mild cardiomegaly and vascular congestion. No confluent opacities, effusions or edema. No acute bony abnormality. IMPRESSION: Cardiomegaly, vascular congestion. Electronically Signed   By: Charlett Nose M.D.   On: 01/23/2017 11:53    Procedures Procedures (including critical care time)  Medications Ordered in ED Medications  albuterol (PROVENTIL HFA;VENTOLIN HFA) 108 (90 Base) MCG/ACT inhaler 2 puff (2 puffs Inhalation Given 01/23/17 1317)  albuterol (PROVENTIL) (2.5 MG/3ML) 0.083% nebulizer solution 5 mg (5 mg Nebulization Given 01/23/17 1155)  ipratropium (ATROVENT) nebulizer solution 0.5 mg (0.5 mg Nebulization Given 01/23/17 1156)  predniSONE (DELTASONE) tablet 60 mg (60 mg Oral Given 01/23/17 1158)     Initial Impression / Assessment and Plan / ED Course  I have reviewed the triage vital signs and the nursing notes.  Pertinent labs & imaging results that were available during my care of the patient were reviewed by me and considered in my medical decision making (see chart for details).  36 year old female here with cough and wheezing. History of asthma. She is afebrile and nontoxic. She does have expiratory wheezes on exam but is in no acute distress. Vital signs are stable. Chest x-ray was obtained, mild vascular congestion noted but no edema.  Patient does not appear fluid overloaded on exam.  After treatment here with neb and prednisone, states she feels much better.  VS remain stable without noted hypoxia.  Likely mild asthma exacerbation.  Lower suspicion for acute CHF, PE, or other acute cardiac etiology.  Will d/c home with steroid taper, albuterol inhaler given here.  FU with PCP.  Discussed plan with patient, she acknowledged understanding and agreed with plan of care.  Return precautions given for new or worsening symptoms.    Final Clinical Impressions(s) / ED Diagnoses   Final diagnoses:    Cough  Mild intermittent asthma with exacerbation    New Prescriptions Discharge Medication List as of 01/23/2017  1:11 PM    START taking these medications   Details  predniSONE (DELTASONE) 20 MG tablet Take 40 mg by mouth daily for 3 days, then 20mg  by mouth daily for 3 days, then 10mg  daily for 3 days, Print         Garlon Hatchet, PA-C 01/23/17 1326    Vanetta Mulders, MD 01/29/17 [phone]

## 2017-01-23 NOTE — Discharge Instructions (Signed)
Take prednisone as directed starting tomorrow, you have already had today's dose. Use inhaler when needed for wheezing. Follow-up with your doctor. Return here for new concerns.

## 2017-01-23 NOTE — ED Triage Notes (Signed)
Pt having increased sob and cough x 2 daysl.  Pt using inhaler without relief.
